# Patient Record
Sex: Female | Born: 1965 | Race: White | Hispanic: No | Marital: Married | Smoking: Former smoker
Health system: Southern US, Community
[De-identification: ages and names within clinical notes are randomized; demographics above are authoritative.]

## PROBLEM LIST (undated history)

## (undated) DIAGNOSIS — L03319 Cellulitis of trunk, unspecified: Secondary | ICD-10-CM

## (undated) DIAGNOSIS — J449 Chronic obstructive pulmonary disease, unspecified: Secondary | ICD-10-CM

## (undated) DIAGNOSIS — I509 Heart failure, unspecified: Secondary | ICD-10-CM

## (undated) DIAGNOSIS — R600 Localized edema: Secondary | ICD-10-CM

## (undated) DIAGNOSIS — I10 Essential (primary) hypertension: Secondary | ICD-10-CM

## (undated) HISTORY — DX: Essential (primary) hypertension: I10

## (undated) HISTORY — DX: Cellulitis of trunk, unspecified: L03.319

## (undated) HISTORY — PX: MASS EXCISION: SHX2000

## (undated) HISTORY — DX: Heart failure, unspecified: I50.9

## (undated) HISTORY — DX: Localized edema: R60.0

---

## 1999-09-05 ENCOUNTER — Emergency Department (HOSPITAL_COMMUNITY): Admission: EM | Admit: 1999-09-05 | Discharge: 1999-09-05 | Payer: Self-pay | Admitting: Emergency Medicine

## 1999-09-22 ENCOUNTER — Emergency Department (HOSPITAL_COMMUNITY): Admission: EM | Admit: 1999-09-22 | Discharge: 1999-09-23 | Payer: Self-pay

## 1999-10-15 ENCOUNTER — Other Ambulatory Visit: Admission: RE | Admit: 1999-10-15 | Discharge: 1999-10-15 | Payer: Self-pay | Admitting: Obstetrics and Gynecology

## 2000-05-28 ENCOUNTER — Emergency Department (HOSPITAL_COMMUNITY): Admission: EM | Admit: 2000-05-28 | Discharge: 2000-05-28 | Payer: Self-pay | Admitting: Emergency Medicine

## 2010-09-07 ENCOUNTER — Emergency Department: Payer: Self-pay | Admitting: Emergency Medicine

## 2011-08-24 ENCOUNTER — Emergency Department (HOSPITAL_COMMUNITY)
Admission: EM | Admit: 2011-08-24 | Discharge: 2011-08-24 | Disposition: A | Discharge status: Home / Self Care | Payer: Self-pay | Attending: Emergency Medicine | Admitting: Emergency Medicine

## 2011-08-24 ENCOUNTER — Encounter (HOSPITAL_COMMUNITY): Payer: Self-pay | Admitting: Emergency Medicine

## 2011-08-24 DIAGNOSIS — E119 Type 2 diabetes mellitus without complications: Secondary | ICD-10-CM | POA: Insufficient documentation

## 2011-08-24 DIAGNOSIS — M545 Low back pain, unspecified: Secondary | ICD-10-CM | POA: Insufficient documentation

## 2011-08-24 HISTORY — DX: Chronic obstructive pulmonary disease, unspecified: J44.9

## 2011-08-24 MED ORDER — CYCLOBENZAPRINE HCL 10 MG PO TABS
10.0000 mg | ORAL_TABLET | Freq: Once | ORAL | Status: AC
  Administered 2011-08-24: 10 mg via ORAL
  Filled 2011-08-24: qty 1

## 2011-08-24 MED ORDER — TRAMADOL HCL 50 MG PO TABS: 50.0000 mg | ORAL_TABLET | Freq: Four times a day (QID) | ORAL | Status: AC | PRN

## 2011-08-24 MED ORDER — CYCLOBENZAPRINE HCL 10 MG PO TABS: 10.0000 mg | ORAL_TABLET | Freq: Three times a day (TID) | ORAL | Status: AC | PRN

## 2011-08-24 MED ORDER — TRAMADOL HCL 50 MG PO TABS
50.0000 mg | ORAL_TABLET | Freq: Once | ORAL | Status: AC
  Administered 2011-08-24: 50 mg via ORAL
  Filled 2011-08-24: qty 1

## 2011-08-24 NOTE — ED Provider Notes (Signed)
History     CSN: [id number]  Arrival date & time 08/24/11  1030   First MD Initiated Contact with Patient 08/24/11 1127      Chief Complaint  Patient presents with  . Back Pain    (Consider location/radiation/quality/duration/timing/severity/associated sxs/prior treatment) Patient is a 46 y.o. female presenting with back pain. The history is provided by the patient.  Back Pain  This is a new problem. The current episode started more than 1 week ago. The problem occurs constantly. The problem has been gradually worsening. The pain is associated with no known injury. The pain radiates to the right thigh and right foot. The symptoms are aggravated by certain positions. Pertinent negatives include no fever, no numbness and no weakness. Associated symptoms comments: Lower right back pain for 2 months, initially intermittent, now constant. No numbness, tingling or weakness. No urinary or fecal incontinence. Worse with movement, still painful with rest at this point. Alleve without relief..    Past Medical History  Diagnosis Date  . Diabetes mellitus   . COPD (chronic obstructive pulmonary disease)     History reviewed. No pertinent past surgical history.  History reviewed. No pertinent family history.  History  Substance Use Topics  . Smoking status: Current Everyday Smoker  . Smokeless tobacco: Not on file  . Alcohol Use: No    OB History    Grav Para Term Preterm Abortions TAB SAB Ect Mult Living                  Review of Systems  Constitutional: Negative for fever.  Gastrointestinal: Negative.  Negative for nausea.  Genitourinary: Negative.   Musculoskeletal: Positive for back pain.  Skin: Negative.   Neurological: Negative.  Negative for weakness and numbness.    Allergies  Review of patient's allergies indicates no known allergies.  Home Medications   Current Outpatient Rx  Name Route Sig Dispense Refill  . ALBUTEROL SULFATE HFA 108 (90 BASE) MCG/ACT IN AERS  Inhalation Inhale 2 puffs into the lungs every 6 (six) hours as needed. For SOB    . ALBUTEROL SULFATE (2.5 MG/3ML) 0.083% IN NEBU Nebulization Take 2.5 mg by nebulization every 6 (six) hours as needed. For SOB    . BECLOMETHASONE DIPROPIONATE 80 MCG/ACT IN AERS Inhalation Inhale 1 puff into the lungs 2 (two) times daily.    . IBUPROFEN 200 MG PO TABS Oral Take 400 mg by mouth every 6 (six) hours as needed. For pain    . METFORMIN HCL 500 MG PO TABS Oral Take 500 mg by mouth 2 (two) times daily with a meal.    . NAPROXEN SODIUM 220 MG PO TABS Oral Take 220 mg by mouth as needed. For pain    . QUINAPRIL HCL 10 MG PO TABS Oral Take 10 mg by mouth at bedtime.      BP 134/78  Pulse 70  Temp(Src) 98.4 F (36.9 C) (Oral)  Resp 18  SpO2 94%  Physical Exam  Constitutional: She is oriented to person, place, and time. She appears well-developed and well-nourished.  HENT:  Head: Normocephalic.  Neck: Normal range of motion. Neck supple.  Cardiovascular: Normal rate and regular rhythm.   Pulmonary/Chest: Effort normal and breath sounds normal.  Abdominal: Soft. Bowel sounds are normal. There is no tenderness. There is no rebound and no guarding.  Musculoskeletal: Normal range of motion.       Mild lumbar and paralumbar tenderness without swelling or discoloration.  Neurological: She is alert and  oriented to person, place, and time. She has normal reflexes. Coordination normal.       Ambulatory with steady gait. Straight leg raise negative bilaterally. No deficits on neurovascular or lower extremity sensory exam.  Skin: Skin is warm and dry. No rash noted.  Psychiatric: She has a normal mood and affect.    ED Course  Procedures (including critical care time)  Labs Reviewed - No data to display No results found.   No diagnosis found. 1. Low back pain   MDM  Pain x 2 months without focal neurologic deficit. Will treat symptomatically and refer to ortho for persistent  symptoms        Rodena Medin, PA-C 08/24/11 1141

## 2011-08-24 NOTE — Discharge Instructions (Signed)
TAKE MEDICATIONS AS PRESCRIBED. CONTINUE TO ALTERNATE WARM AND COOL COMPRESSES AND REST THE BACK. IF NO BETTER IN 3-4 ADDITIONAL DAYS, FOLLOW UP WITH DR. CHANDLER FOR FURTHER OUTPATIENT EVALUATION.  Back Pain, Adult Low back pain is very common. About 1 in 5 people have back pain.The cause of low back pain is rarely dangerous. The pain often gets better over time.About half of people with a sudden onset of back pain feel better in just 2 weeks. About 8 in 10 people feel better by 6 weeks.  CAUSES Some common causes of back pain include:  Strain of the muscles or ligaments supporting the spine.   Wear and tear (degeneration) of the spinal discs.   Arthritis.   Direct injury to the back.  DIAGNOSIS Most of the time, the direct cause of low back pain is not known.However, back pain can be treated effectively even when the exact cause of the pain is unknown.Answering your caregiver's questions about your overall health and symptoms is one of the most accurate ways to make sure the cause of your pain is not dangerous. If your caregiver needs more information, he or she may order lab work or imaging tests (X-rays or MRIs).However, even if imaging tests show changes in your back, this usually does not require surgery. HOME CARE INSTRUCTIONS For many people, back pain returns.Since low back pain is rarely dangerous, it is often a condition that people can learn to [facility] their own.   Remain active. It is stressful on the back to sit or stand in one place. Do not sit, drive, or stand in one place for more than 30 minutes at a time. Take short walks on level surfaces as soon as pain allows.Try to increase the length of time you walk each day.   Do not stay in bed.Resting more than 1 or 2 days can delay your recovery.   Do not avoid exercise or work.Your body is made to move.It is not dangerous to be active, even though your back may hurt.Your back will likely heal faster if you return to  being active before your pain is gone.   Pay attention to your body when you bend and lift. Many people have less discomfortwhen lifting if they bend their knees, keep the load close to their bodies,and avoid twisting. Often, the most comfortable positions are those that put less stress on your recovering back.   Find a comfortable position to sleep. Use a firm mattress and lie on your side with your knees slightly bent. If you lie on your back, put a pillow under your knees.   Only take over-the-counter or prescription medicines as directed by your caregiver. Over-the-counter medicines to reduce pain and inflammation are often the most helpful.Your caregiver may prescribe muscle relaxant drugs.These medicines help dull your pain so you can more quickly return to your normal activities and healthy exercise.   Put ice on the injured area.   Put ice in a plastic bag.   Place a towel between your skin and the bag.   Leave the ice on for 15 to 20 minutes, 3 to 4 times a day for the first 2 to 3 days. After that, ice and heat may be alternated to reduce pain and spasms.   Ask your caregiver about trying back exercises and gentle massage. This may be of some benefit.   Avoid feeling anxious or stressed.Stress increases muscle tension and can worsen back pain.It is important to recognize when you are anxious or  stressed and learn ways to manage it.Exercise is a great option.  SEEK MEDICAL CARE IF:  You have pain that is not relieved with rest or medicine.   You have pain that does not improve in 1 week.   You have new symptoms.   You are generally not feeling well.  SEEK IMMEDIATE MEDICAL CARE IF:   You have pain that radiates from your back into your legs.   You develop new bowel or bladder control problems.   You have unusual weakness or numbness in your arms or legs.   You develop nausea or vomiting.   You develop abdominal pain.   You feel faint.  Document Released:  03/21/2005 Document Revised: 03/10/2011 Document Reviewed: 08/09/2010 [facility] Patient Information 2012 [place], [place].

## 2011-08-24 NOTE — ED Provider Notes (Signed)
Medical screening examination/treatment/procedure(s) were performed by non-physician practitioner and as supervising physician I was immediately available for consultation/collaboration.   Gavin Pound. Tonika Eden, MD 08/24/11 1143

## 2011-08-24 NOTE — ED Notes (Signed)
Pt c/o lower back pain with radiation down right leg x 2 months that is not getting better; pt denies obvious injury

## 2014-05-13 ENCOUNTER — Emergency Department: Payer: Self-pay | Admitting: Emergency Medicine

## 2014-11-27 ENCOUNTER — Emergency Department: Payer: Self-pay

## 2014-11-27 ENCOUNTER — Emergency Department
Admission: EM | Admit: 2014-11-27 | Discharge: 2014-11-27 | Disposition: A | Discharge status: Other Acute Inpatient Hospital | Payer: Self-pay | Attending: Emergency Medicine | Admitting: Emergency Medicine

## 2014-11-27 ENCOUNTER — Encounter: Payer: Self-pay | Admitting: Emergency Medicine

## 2014-11-27 DIAGNOSIS — L03311 Cellulitis of abdominal wall: Secondary | ICD-10-CM | POA: Insufficient documentation

## 2014-11-27 DIAGNOSIS — R000 Tachycardia, unspecified: Secondary | ICD-10-CM | POA: Insufficient documentation

## 2014-11-27 DIAGNOSIS — E119 Type 2 diabetes mellitus without complications: Secondary | ICD-10-CM | POA: Insufficient documentation

## 2014-11-27 DIAGNOSIS — L0390 Cellulitis, unspecified: Secondary | ICD-10-CM

## 2014-11-27 DIAGNOSIS — A419 Sepsis, unspecified organism: Secondary | ICD-10-CM

## 2014-11-27 DIAGNOSIS — Z7951 Long term (current) use of inhaled steroids: Secondary | ICD-10-CM | POA: Insufficient documentation

## 2014-11-27 DIAGNOSIS — Z720 Tobacco use: Secondary | ICD-10-CM | POA: Insufficient documentation

## 2014-11-27 DIAGNOSIS — Z79899 Other long term (current) drug therapy: Secondary | ICD-10-CM | POA: Insufficient documentation

## 2014-11-27 DIAGNOSIS — M726 Necrotizing fasciitis: Secondary | ICD-10-CM | POA: Insufficient documentation

## 2014-11-27 LAB — URINALYSIS COMPLETE WITH MICROSCOPIC (ARMC ONLY)
BILIRUBIN URINE: NEGATIVE
Bacteria, UA: NONE SEEN
LEUKOCYTES UA: NEGATIVE
NITRITE: NEGATIVE
PH: 5 (ref 5.0–8.0)
Protein, ur: 30 mg/dL — AB
Specific Gravity, Urine: 1.029 (ref 1.005–1.030)

## 2014-11-27 LAB — CBC WITH DIFFERENTIAL/PLATELET
BASOS PCT: 0 %
Basophils Absolute: 0 10*3/uL (ref 0–0.1)
EOS ABS: 0 10*3/uL (ref 0–0.7)
Eosinophils Relative: 0 %
HCT: 41.9 % (ref 35.0–47.0)
HEMOGLOBIN: 14.1 g/dL (ref 12.0–16.0)
Lymphocytes Relative: 4 %
Lymphs Abs: 1 10*3/uL (ref 1.0–3.6)
MCH: 29.3 pg (ref 26.0–34.0)
MCHC: 33.7 g/dL (ref 32.0–36.0)
MCV: 87.1 fL (ref 80.0–100.0)
Monocytes Absolute: 1.3 10*3/uL — ABNORMAL HIGH (ref 0.2–0.9)
Monocytes Relative: 5 %
NEUTROS PCT: 91 %
Neutro Abs: 24.3 10*3/uL — ABNORMAL HIGH (ref 1.4–6.5)
Platelets: 267 10*3/uL (ref 150–440)
RBC: 4.81 MIL/uL (ref 3.80–5.20)
RDW: 13.3 % (ref 11.5–14.5)
WBC: 26.6 10*3/uL — AB (ref 3.6–11.0)

## 2014-11-27 LAB — COMPREHENSIVE METABOLIC PANEL
ALK PHOS: 82 U/L (ref 38–126)
ALT: 13 U/L — AB (ref 14–54)
AST: 16 U/L (ref 15–41)
Albumin: 3.3 g/dL — ABNORMAL LOW (ref 3.5–5.0)
Anion gap: 15 (ref 5–15)
BUN: 12 mg/dL (ref 6–20)
CALCIUM: 9 mg/dL (ref 8.9–10.3)
CO2: 26 mmol/L (ref 22–32)
CREATININE: 0.97 mg/dL (ref 0.44–1.00)
Chloride: 88 mmol/L — ABNORMAL LOW (ref 101–111)
Glucose, Bld: 409 mg/dL — ABNORMAL HIGH (ref 65–99)
Potassium: 3.8 mmol/L (ref 3.5–5.1)
Sodium: 129 mmol/L — ABNORMAL LOW (ref 135–145)
Total Bilirubin: 1.4 mg/dL — ABNORMAL HIGH (ref 0.3–1.2)
Total Protein: 7.2 g/dL (ref 6.5–8.1)

## 2014-11-27 LAB — LACTIC ACID, PLASMA
LACTIC ACID, VENOUS: 1.5 mmol/L (ref 0.5–2.0)
LACTIC ACID, VENOUS: 1.1 mmol/L (ref 0.5–2.0)

## 2014-11-27 MED ORDER — IOHEXOL 300 MG/ML  SOLN
100.0000 mL | Freq: Once | INTRAMUSCULAR | Status: AC | PRN
Start: 2014-11-27 — End: 2014-11-27
  Administered 2014-11-27: 100 mL via INTRAVENOUS

## 2014-11-27 MED ORDER — HYDROMORPHONE HCL 1 MG/ML IJ SOLN
1.0000 mg | Freq: Once | INTRAMUSCULAR | Status: AC
  Administered 2014-11-27: 1 mg via INTRAVENOUS
  Filled 2014-11-27: qty 1

## 2014-11-27 MED ORDER — HYDROMORPHONE HCL 1 MG/ML IJ SOLN
INTRAMUSCULAR | Status: AC
  Filled 2014-11-27: qty 1

## 2014-11-27 MED ORDER — HYDROMORPHONE HCL 1 MG/ML IJ SOLN: 1.0000 mg | Freq: Once | INTRAMUSCULAR | Status: AC

## 2014-11-27 MED ORDER — SODIUM CHLORIDE 0.9 % IV BOLUS (SEPSIS)
30.0000 mL/kg | Freq: Once | INTRAVENOUS | Status: AC
  Administered 2014-11-27: 1000 mL via INTRAVENOUS

## 2014-11-27 MED ORDER — IPRATROPIUM-ALBUTEROL 0.5-2.5 (3) MG/3ML IN SOLN
3.0000 mL | Freq: Once | RESPIRATORY_TRACT | Status: AC
  Administered 2014-11-27: 3 mL via RESPIRATORY_TRACT
  Filled 2014-11-27: qty 3

## 2014-11-27 MED ORDER — IOHEXOL 240 MG/ML SOLN
25.0000 mL | INTRAMUSCULAR | Status: AC
  Administered 2014-11-27 (×2): 25 mL via ORAL

## 2014-11-27 MED ORDER — PIPERACILLIN-TAZOBACTAM 3.375 G IVPB 30 MIN
3.3750 g | Freq: Once | INTRAVENOUS | Status: AC
  Administered 2014-11-27: 3.375 g via INTRAVENOUS
  Filled 2014-11-27: qty 50

## 2014-11-27 MED ORDER — ONDANSETRON HCL 4 MG/2ML IJ SOLN
4.0000 mg | Freq: Once | INTRAMUSCULAR | Status: AC
  Administered 2014-11-27: 4 mg via INTRAVENOUS
  Filled 2014-11-27: qty 2

## 2014-11-27 MED ORDER — VANCOMYCIN HCL IN DEXTROSE 1-5 GM/200ML-% IV SOLN
1000.0000 mg | Freq: Once | INTRAVENOUS | Status: AC
  Administered 2014-11-27: 1000 mg via INTRAVENOUS
  Filled 2014-11-27: qty 200

## 2014-11-27 MED ORDER — SODIUM CHLORIDE 0.9 % IV BOLUS (SEPSIS): 1000.0000 mL | Freq: Once | INTRAVENOUS | Status: DC

## 2014-11-27 NOTE — ED Provider Notes (Signed)
[facility] Emergency Department Provider Note   ____________________________________________  Time seen: 8 AM I have reviewed the triage vital signs and the triage nursing note.  HISTORY  Chief Complaint Abdominal Pain and Rash   Historian Patient  HPI Belinda Young Belinda Young is a 49 y.o. female who noticed a bite or bump on the mons pubis area last Friday, and she had been soaking in Epsom salts, and it just got bigger over the past 6 days. For the past 3 days she hasn't been able to eat due to nausea and vomiting. She's had chills without a known fever. Symptoms are moderate to severe. No coughing, no trouble breathing. Redness and swelling have extended from the mons pubis up to the pannus of the abdomen    Past Medical History  Diagnosis Date  . Diabetes mellitus   . COPD (chronic obstructive pulmonary disease)     Patient Active Problem List   Diagnosis Date Noted  . Sepsis due to cellulitis     No past surgical history on file.  Current Outpatient Rx  Name  Route  Sig  Dispense  Refill  . glipiZIDE (GLUCOTROL) 5 MG tablet   Oral   Take 5 mg by mouth daily before breakfast.         . metFORMIN (GLUCOPHAGE) 500 MG tablet   Oral   Take 500 mg by mouth 2 (two) times daily with a meal.         . quinapril (ACCUPRIL) 10 MG tablet   Oral   Take 10 mg by mouth at bedtime.         Marland Kitchen albuterol (PROVENTIL HFA;VENTOLIN HFA) 108 (90 BASE) MCG/ACT inhaler   Inhalation   Inhale 2 puffs into the lungs every 6 (six) hours as needed. For SOB         . albuterol (PROVENTIL) (2.5 MG/3ML) 0.083% nebulizer solution   Nebulization   Take 2.5 mg by nebulization every 6 (six) hours as needed. For SOB         . beclomethasone (QVAR) 80 MCG/ACT inhaler   Inhalation   Inhale 1 puff into the lungs 2 (two) times daily.         Marland Kitchen ibuprofen (ADVIL,MOTRIN) 200 MG tablet   Oral   Take 400 mg by mouth every 6 (six) hours as needed. For pain         .  naproxen sodium (ANAPROX) 220 MG tablet   Oral   Take 220 mg by mouth as needed. For pain           Allergies Review of patient's allergies indicates no known allergies.  No family history on file.  Social History Social History  Substance Use Topics  . Smoking status: Current Every Day Smoker  . Smokeless tobacco: None  . Alcohol Use: Yes    Review of Systems  Constitutional: Chills Eyes: Negative for visual changes. ENT: Negative for sore throat. Cardiovascular: Negative for chest pain. Respiratory: Negative for shortness of breath. Gastrointestinal: Negative for diarrhea Genitourinary: Negative for dysuria. Musculoskeletal: Negative for back pain. Skin: Positive for redness and swelling as per history of present illness Neurological: Negative for headache. 10 point Review of Systems otherwise negative ____________________________________________   PHYSICAL EXAM:  VITAL SIGNS: ED Triage Vitals  Enc Vitals Group     BP 11/27/14 0800 139/67 mmHg     Pulse Rate 11/27/14 0800 131     Resp 11/27/14 0800 24     Temp 11/27/14 0800  98.1 F (36.7 C)     Temp Source 11/27/14 0800 Oral     SpO2 11/27/14 0800 92 %     Weight --      Height --      Head Cir --      Peak Flow --      Pain Score 11/27/14 0801 10     Pain Loc --      Pain Edu? --      Excl. in GC? --      Constitutional: Alert and oriented. Well appearing overall, but tearful and in pain. Eyes: Conjunctivae are normal. PERRL. Normal extraocular movements. ENT   Head: Normocephalic and atraumatic.   Nose: No congestion/rhinnorhea.   Mouth/Throat: Mucous membranes are moist.   Neck: No stridor. Cardiovascular/Chest: Tachycardic, but regular rhythm.  No murmurs, rubs, or gallops. Respiratory: Normal respiratory effort without tachypnea nor retractions. Breath sounds are clear and equal bilaterally. No wheezes/rales/rhonchi. Gastrointestinal: Soft. No distention, no guarding, no rebound.  Tender on the underside of the pannus where it is erythematous and streaking. No crepitus.  Genitourinary/rectal: No focal area of fluctuance, however the mons pubis is extremely swollen and red without any drainage. There is no crepitus. Musculoskeletal: Nontender with normal range of motion in all extremities. No joint effusions.  No lower extremity tenderness.  No edema. Neurologic:  Normal speech and language. No gross or focal neurologic deficits are appreciated. Skin:  Skin is warm, dry and intact. No rash noted. Psychiatric: Mood and affect are normal. Speech and behavior are normal. Patient exhibits appropriate insight and judgment.  ____________________________________________   EKG I, Governor Rooks, MD, the attending physician have personally viewed and interpreted all ECGs.  120 bpm. Sinus tachycardia. Narrow QRS. Normal axis. Normal ST and T-wave. ____________________________________________  LABS (pertinent positives/negatives)  Lactate 1.1 Complete metabolic panel significant for sodium 129, glucose 49 and otherwise no significant abnormalities White blood, 26.6 with a left shift. Hemoglobin 14.1 and the count is 267 Lactate is 1.5 Urinalysis is negative for signs of infection. There are 2+ ketones and greater than 500 glucose  ____________________________________________  RADIOLOGY All Xrays were viewed by me. Imaging interpreted by Radiologist.  Chest x-ray portable: Negative  CT pelvis:  IMPRESSION: Extensive subcutaneous fat stranding and emphysema within the left groin extending to the lower abdominal wall. Findings are concerning for infectious process with gas-forming organisms, necrotizing fasciitis is not excluded.  These results were called by telephone at the time of interpretation on 11/27/2014 at 1:41 pm to Dr. Governor Rooks , who verbally acknowledged these results.  __________________________________________  PROCEDURES  Procedure(s) performed:  None  Critical Care performed: CRITICAL CARE Performed by: Governor Rooks   Total critical care time: 45 minutes Critical care time was exclusive of separately billable procedures and treating other patients.  Critical care was necessary to treat or prevent imminent or life-threatening deterioration.  Critical care was time spent personally by me on the following activities: development of treatment plan with patient and/or surrogate as well as nursing, discussions with consultants, evaluation of patient's response to treatment, examination of patient, obtaining history from patient or surrogate, ordering and performing treatments and interventions, ordering and review of laboratory studies, ordering and review of radiographic studies, pulse oximetry and re-evaluation of patient's condition.   ____________________________________________   ED COURSE / ASSESSMENT AND PLAN  CONSULTATIONS: Patient is consultation with general surgeon, Dr. Excell Seltzer. He is clinically concerned about excising fasciitis with extension into the left thigh as well as the mons  pubis, and suspects the patient may need a larger surgery that requires orthopedic, general surgery, and possibly plastic surgery reconstruction, and is recommending transfer to tertiary care center.  Pertinent labs & imaging results that were available during my care of the patient were reviewed by me and considered in my medical decision making (see chart for details).  I'm concerned about possible sepsis in this patient who is weighted approximately one week since onset, with generalized symptoms over the past 3 days, and obvious large cellulitis of the pannus in abdomen, and tachycardia. She was started on sepsis protocol, started with IV fluid bolus, antibiotics to cover for sepsis due to cellulitis. I chose Zosyn and vancomycin. Clinically on exam I don't find an area of focal fluctuance or pointing to blindly open. I plan to obtain a CT of the  pelvis if the kidneys are amenable to this to assess whether or not there might be an underlying abscess for which surgical labs would be helpful. Patient will need hospital admission due to the severity of her condition.   ----------------------------------------- 2:23 PM on 11/27/2014 -----------------------------------------  I discussed the results of the CT scan with the radiologist and reviewed myself the images which are showing gas in the affected area and tracking down into the left groin as well. I discussed the case with general surgeon who recommended tertiary care due to the likely large debridement that will be needed. Patient care was accepted in transfer by Dr. Myrtis Ser to [facility] emergency Department directly. Patient will take [facility] helicopter as this is the fastest transport at this point time.  No hypotension. Patient does have return of pain and she's been treated with Dilaudid for symptom control. Patient and family member were updated to the severity of the condition.  Patient / Family / Caregiver informed of clinical course, medical decision-making process, and agree with plan.     ___________________________________________   FINAL CLINICAL IMPRESSION(S) / ED DIAGNOSES   Final diagnoses:  Sepsis due to cellulitis  Necrotizing fasciitis       Governor Rooks, MD 11/27/14 1426

## 2014-11-27 NOTE — ED Notes (Signed)
Patient transported to CT 

## 2014-11-27 NOTE — ED Notes (Signed)
Report given to [facility] with [facility] flight and Missy with [facility].

## 2014-11-27 NOTE — ED Notes (Signed)
Says started with itching left leg and thoughrt it was abscess.  Now it has spread into abd and she has fever and vomiting

## 2014-11-27 NOTE — Consult Note (Signed)
Surgical Consultation  11/27/2014  Belinda Young is an 49 y.o. female.   CC: Left groin pain  HPI: I was called to the emergency room to see a patient with likely necrotizing fasciitis.  Patient describes having a small pimple or boil that she picked at 5 or 6 days ago and it has gradually worsened since then she states that this is the worst pain she has ever had. She's had fevers chills nausea vomiting and is shaking and a chill right now. She's also had diarrhea. She is diabetic. He also smokes tobacco products. She states the pain started on her inner thigh on the left side and extended up onto her fold that her pubis and into her abdomen. She also has pain in her medial thigh. He has had no drainage.  Past Medical History  Diagnosis Date  . Diabetes mellitus   . COPD (chronic obstructive pulmonary disease)     No past surgical history on file.  No family history on file.  Social History:  reports that she has been smoking.  She does not have any smokeless tobacco history on file. She reports that she drinks alcohol. She reports that she does not use illicit drugs.  Allergies: No Known Allergies  Medications reviewed.   Review of Systems:   Review of Systems  Constitutional: Positive for fever, chills, malaise/fatigue and diaphoresis.  HENT: Negative for sore throat.   Eyes: Negative.   Respiratory: Negative for cough, hemoptysis, shortness of breath and wheezing.   Cardiovascular: Negative for chest pain, palpitations and leg swelling.  Gastrointestinal: Positive for nausea, vomiting, abdominal pain and diarrhea. Negative for heartburn, constipation, blood in stool and melena.  Genitourinary: Positive for flank pain. Negative for dysuria, urgency, frequency and hematuria.  Musculoskeletal: Negative.   Skin: Negative.        Boil  Neurological: Positive for weakness and headaches.  Endo/Heme/Allergies: Negative.   Psychiatric/Behavioral: Negative.      Physical  Exam:  BP 108/67 mmHg  Pulse 114  Temp(Src) 98.1 F (36.7 C) (Oral)  Resp 20  Wt 232 lb (105.235 kg)  SpO2 97%  LMP 11/02/2014  Physical Exam  Constitutional: She is oriented to person, place, and time and well-developed, well-nourished, and in no distress.  Obese He is in the present time experiencing shaking chill  HENT:  Head: Normocephalic and atraumatic.  Eyes: Pupils are equal, round, and reactive to light.  Neck: Normal range of motion. Neck supple.  Cardiovascular: Normal rate and regular rhythm.   Pulmonary/Chest: Effort normal. No respiratory distress. She has no wheezes.  Abdominal: Soft. She exhibits no distension. There is no tenderness. There is no rebound and no guarding.  Musculoskeletal: She exhibits edema and tenderness.  Inspection of the vulva left groin left thigh and perianal area demonstrates massive edema especially of the suprapubic area and vulva she is markedly tender throughout from her posterior buttock area onto her medial thigh in fact she is extremely or exquisitely tender in the medial thigh and extends up into the pubis and towards the left groin and abdomen there is edema and peau d'orange especially of the mons pubis. Areas no drainage and no obvious crepitus palpable..  Lymphadenopathy:    She has no cervical adenopathy.  Neurological: She is alert and oriented to person, place, and time.  Skin: There is erythema.  Psychiatric: Affect normal.      Results for orders placed or performed during the hospital encounter of 11/27/14 (from the past 48 hour(s))  Comprehensive metabolic panel     Status: Abnormal   Collection Time: 11/27/14  8:31 AM  Result Value Ref Range   Sodium 129 (L) 135 - 145 mmol/L   Potassium 3.8 3.5 - 5.1 mmol/L   Chloride 88 (L) 101 - 111 mmol/L   CO2 26 22 - 32 mmol/L   Glucose, Bld 409 (H) 65 - 99 mg/dL   BUN 12 6 - 20 mg/dL   Creatinine, Ser 0.97 0.44 - 1.00 mg/dL   Calcium 9.0 8.9 - 10.3 mg/dL   Total Protein  7.2 6.5 - 8.1 g/dL   Albumin 3.3 (L) 3.5 - 5.0 g/dL   AST 16 15 - 41 U/L   ALT 13 (L) 14 - 54 U/L   Alkaline Phosphatase 82 38 - 126 U/L   Total Bilirubin 1.4 (H) 0.3 - 1.2 mg/dL   GFR calc non Af Amer >60 >60 mL/min   GFR calc Af Amer >60 >60 mL/min    Comment: (NOTE) The eGFR has been calculated using the CKD EPI equation. This calculation has not been validated in all clinical situations. eGFR's persistently <60 mL/min signify possible Chronic Kidney Disease.    Anion gap 15 5 - 15  CBC with Differential     Status: Abnormal   Collection Time: 11/27/14  8:31 AM  Result Value Ref Range   WBC 26.6 (H) 3.6 - 11.0 K/uL   RBC 4.81 3.80 - 5.20 MIL/uL   Hemoglobin 14.1 12.0 - 16.0 g/dL   HCT 41.9 35.0 - 47.0 %   MCV 87.1 80.0 - 100.0 fL   MCH 29.3 26.0 - 34.0 pg   MCHC 33.7 32.0 - 36.0 g/dL   RDW 13.3 11.5 - 14.5 %   Platelets 267 150 - 440 K/uL   Neutrophils Relative % 91 %   Neutro Abs 24.3 (H) 1.4 - 6.5 K/uL   Lymphocytes Relative 4 %   Lymphs Abs 1.0 1.0 - 3.6 K/uL   Monocytes Relative 5 %   Monocytes Absolute 1.3 (H) 0.2 - 0.9 K/uL   Eosinophils Relative 0 %   Eosinophils Absolute 0.0 0 - 0.7 K/uL   Basophils Relative 0 %   Basophils Absolute 0.0 0 - 0.1 K/uL  Lactic acid, plasma     Status: None   Collection Time: 11/27/14  8:31 AM  Result Value Ref Range   Lactic Acid, Venous 1.5 0.5 - 2.0 mmol/L  Urinalysis complete, with microscopic     Status: Abnormal   Collection Time: 11/27/14  8:31 AM  Result Value Ref Range   Color, Urine YELLOW (A) YELLOW   APPearance CLEAR (A) CLEAR   Glucose, UA >500 (A) NEGATIVE mg/dL   Bilirubin Urine NEGATIVE NEGATIVE   Ketones, ur 2+ (A) NEGATIVE mg/dL   Specific Gravity, Urine 1.029 1.005 - 1.030   Hgb urine dipstick 2+ (A) NEGATIVE   pH 5.0 5.0 - 8.0   Protein, ur 30 (A) NEGATIVE mg/dL   Nitrite NEGATIVE NEGATIVE   Leukocytes, UA NEGATIVE NEGATIVE   RBC / HPF 0-5 0 - 5 RBC/hpf   WBC, UA 0-5 0 - 5 WBC/hpf   Bacteria, UA NONE  SEEN NONE SEEN   Squamous Epithelial / LPF 0-5 (A) NONE SEEN  Lactic acid, plasma     Status: None   Collection Time: 11/27/14 12:25 PM  Result Value Ref Range   Lactic Acid, Venous 1.1 0.5 - 2.0 mmol/L   Ct Pelvis W Contrast  11/27/2014   CLINICAL DATA:  Fever and vomiting.  Possible leg abscess.  EXAM: CT PELVIS WITH CONTRAST  TECHNIQUE: Multidetector CT imaging of the pelvis was performed using the standard protocol following the bolus administration of intravenous contrast.  CONTRAST:  118m OMNIPAQUE IOHEXOL 300 MG/ML  SOLN  COMPARISON:  None.  FINDINGS: There is extensive subcutaneous soft tissues emphysema and stranding within the left groin extending into the left and central lower abdominal wall. Several probably reactive left inguinal lymph nodes are seen. There is no evidence of intramuscular or intra-abdominal drainable fluid collection.  There is a wide neck fat containing infraumbilical anterior abdominal wall hernia.  No evidence of fluid within the pelvis or abdomen. Visualized large and small bowel loops are normal in caliber and distribution. Scattered sigmoid diverticulosis is seen.  Normal female genitalia without evidence of suspicious masses.  Visualized portions of the liver, bilateral kidneys have normal appearance.  IMPRESSION: Extensive subcutaneous fat stranding and emphysema within the left groin extending to the lower abdominal wall. Findings are concerning for infectious process with gas-forming organisms, necrotizing fasciitis is not excluded.  These results were called by telephone at the time of interpretation on 11/27/2014 at 1:41 pm to Dr. RLisa Roca, who verbally acknowledged these results.   Electronically Signed   By: DFidela SalisburyM.D.   On: 11/27/2014 13:43   Dg Chest Port 1 View  11/27/2014   CLINICAL DATA:  Fever today.  Smoker.  EXAM: PORTABLE CHEST - 1 VIEW  COMPARISON:  None.  FINDINGS: The lungs are clear. Heart size is normal. No pneumothorax or  pleural effusion. No focal bony abnormality.  IMPRESSION: No acute disease.   Electronically Signed   By: TInge RiseM.D.   On: 11/27/2014 11:48    Assessment/Plan:  This is a critically ill patient she is floridly septic in a shaking chill at this point and this is likely representative of SIRS related to a necrotizing deep soft tissue infection involving the medial thigh towards the femur all the bone and extending up into the mons pubis and up onto the left groin. CT scan is personally reviewed as are her labs showing elevated white blood cell count etc. This patient is in critical condition and requires urgent if not emergent operation to save her life I discussed with Dr. LReita Clichethis patient's emergent condition and the fact that without a full complement of surgical subspecialties including plastic surgery for coverage this patient could quite possibly lose her life. [facility] to care for this patient at this time especially since we have no plastic surgery team to cover such a likely large resulting wound. But more importantly this gas forming organism is pervasive through the soft tissues involving multiple anatomic areas and is appearing extremely aggressive. I spoke with Dr. LReita [facility]concerning [facility].  RFlorene Glen MD, FACS

## 2014-11-29 LAB — URINE CULTURE

## 2014-12-02 LAB — CULTURE, BLOOD (ROUTINE X 2)
CULTURE: NO GROWTH
Culture: NO GROWTH

## 2019-05-30 ENCOUNTER — Encounter: Payer: Self-pay | Admitting: *Deleted

## 2019-06-03 ENCOUNTER — Other Ambulatory Visit: Payer: Self-pay

## 2019-06-03 ENCOUNTER — Encounter: Payer: Self-pay | Admitting: Cardiology

## 2019-06-03 ENCOUNTER — Ambulatory Visit (INDEPENDENT_AMBULATORY_CARE_PROVIDER_SITE_OTHER): Payer: Medicaid Other | Admitting: Cardiology

## 2019-06-03 VITALS — BP 126/70 | HR 95 | Ht 67.0 in | Wt 280.5 lb

## 2019-06-03 DIAGNOSIS — R600 Localized edema: Secondary | ICD-10-CM

## 2019-06-03 DIAGNOSIS — F17200 Nicotine dependence, unspecified, uncomplicated: Secondary | ICD-10-CM | POA: Diagnosis not present

## 2019-06-03 NOTE — Progress Notes (Signed)
Cardiology Office Note:    Date:  06/03/2019   ID:  BERT GIVANS, DOB 1965-11-03, MRN [id number]  PCP:  Sandrea Hughs, NP  Cardiologist:  Debbe Odea, MD  Electrophysiologist:  None   Referring MD: Marya Fossa, PA-C   Chief Complaint  Patient presents with  . New Patient (Initial Visit)    RLE edema; Meds verbally reviewed with patient.    History of Present Illness:    SHEYLI HORWITZ is a 54 y.o. female with a hx of COPD, diabetes, current smoker x30+ years who presents due to lower extremity edema.  Patient noticed bilateral lower extremity edema 10 days ago.  She saw her primary care provider who put her on diuretics and antibiotics.  The left lower extremity improved but the right seem to stay the same if not get slightly worse.  The right lower extremity edema also appeared reddish.  She denies any history of heart disease.  She is a current smoker x30+ years.  She is working on quitting, now down to 3 cigarettes a day.  She denies any history of blood clots.  Past Medical History:  Diagnosis Date  . Cellulitis of trunk   . COPD (chronic obstructive pulmonary disease) (HCC)   . Diabetes mellitus   . Pedal edema     Past Surgical History:  Procedure Laterality Date  . CESAREAN SECTION    . MASS EXCISION     spider bite in pubic area    Current Medications: Current Meds  Medication Sig  . albuterol (PROVENTIL HFA;VENTOLIN HFA) 108 (90 BASE) MCG/ACT inhaler Inhale 2 puffs into the lungs every 6 (six) hours as needed. For SOB  . albuterol (PROVENTIL) (2.5 MG/3ML) 0.083% nebulizer solution Take 2.5 mg by nebulization every 6 (six) hours as needed. For SOB  . Fluticasone-Salmeterol (ADVAIR) 250-50 MCG/DOSE AEPB Inhale 1 puff into the lungs 2 (two) times daily.  Marland Kitchen gabapentin (NEURONTIN) 300 MG capsule Take 300 mg by mouth at bedtime.  . Insulin Aspart (NOVOLOG FLEXPEN [place]) Inject 15 Units into the skin with breakfast, with lunch, and with evening meal.  . insulin NPH  Human (NOVOLIN N) 100 UNIT/ML injection Inject 15 Units into the skin in the morning and at bedtime.  . quinapril (ACCUPRIL) 10 MG tablet Take 10 mg by mouth at bedtime.  Marland Kitchen tiotropium (SPIRIVA) 18 MCG inhalation capsule Place 18 mcg into inhaler and inhale daily.     Allergies:   Patient has no known allergies.   Social History   Socioeconomic History  . Marital status: Married    Spouse name: Not on file  . Number of children: Not on file  . Years of education: Not on file  . Highest education level: Not on file  Occupational History  . Not on file  Tobacco Use  . Smoking status: Current Every Day Smoker    Packs/day: 0.25    Types: Cigarettes  . Smokeless tobacco: Never Used  Substance and Sexual Activity  . Alcohol use: Yes  . Drug use: No  . Sexual activity: Not on file  Other Topics Concern  . Not on file  Social History Narrative  . Not on file   Social Determinants of Health   Financial Resource Strain:   . Difficulty of Paying Living Expenses: Not on file  Food Insecurity:   . Worried About Programme researcher, broadcasting/film/video in the Last Year: Not on file  . Ran Out of Food in the Last Year: Not on file  Transportation Needs:   . Freight forwarder (Medical): Not on file  . Lack of Transportation (Non-Medical): Not on file  Physical Activity:   . Days of Exercise per Week: Not on file  . Minutes of Exercise per Session: Not on file  Stress:   . Feeling of Stress : Not on file  Social Connections:   . Frequency of Communication with Friends and Family: Not on file  . Frequency of Social Gatherings with Friends and Family: Not on file  . Attends Religious Services: Not on file  . Active Member of Clubs or Organizations: Not on file  . Attends Banker Meetings: Not on file  . Marital Status: Not on file     Family History: The patient's family history includes Aneurysm in her father; COPD in her mother; Diabetes in her mother; Heart failure in her  mother.  ROS:   Please see the history of present illness.     All other systems reviewed and are negative.  EKGs/Labs/Other Studies Reviewed:    The following studies were reviewed today:   EKG:  EKG is  ordered today.  The ekg ordered today demonstrates normal sinus rhythm.  Recent Labs: No results found for requested labs within last 8760 hours.  Recent Lipid Panel No results found for: CHOL, TRIG, HDL, CHOLHDL, VLDL, LDLCALC, LDLDIRECT  Physical Exam:    VS:  BP 126/70 (BP Location: Right Arm, Patient Position: Sitting, Cuff Size: Large)   Pulse 95   Ht 5\' 7"  (1.702 m)   Wt 280 lb 8 oz (127.2 kg)   SpO2 91%   BMI 43.93 kg/m     Wt Readings from Last 3 Encounters:  06/03/19 280 lb 8 oz (127.2 kg)  11/27/14 232 lb (105.2 kg)     GEN:  Well nourished, well developed in no acute distress HEENT: Normal NECK: No JVD; No carotid bruits  LYMPHATICS: No lymphadenopathy CARDIAC: RRR, no murmurs, rubs, gallops RESPIRATORY: Crackles at left lung base noted ABDOMEN: Soft, non-tender, non-distended MUSCULOSKELETAL: 1+ edema on the right lower extremity, RLE appears erythematous.  Trace edema in the left lower extremity. SKIN: Warm and dry NEUROLOGIC:  Alert and oriented x 3 PSYCHIATRIC:  Normal affect   ASSESSMENT:    1. Edema leg   2. Smoking    PLAN:    In order of problems listed above:  1. Patient with bilateral lower extremity edema worse on the right.  Right lower extremity appears erythematous.  Will get echocardiogram to rule out any structural heart disease.  We will also get a venous lower extremity ultrasound to evaluate for DVT. 2. Patient with long history of smoking.  Smoking cessation advised, over 5 minutes spent advising patient.  Follow-up after echocardiogram and lower extremity ultrasound.  This note was generated in part or whole with voice recognition software. Voice recognition is usually quite accurate but there are transcription errors that  can and very often do occur. I apologize for any typographical errors that were not detected and corrected.  Medication Adjustments/Labs and Tests Ordered: Current medicines are reviewed at length with the patient today.  Concerns regarding medicines are outlined above.  Orders Placed This Encounter  Procedures  . EKG 12-Lead  . ECHOCARDIOGRAM COMPLETE  . VAS 11/29/14 LOWER EXTREMITY VENOUS (DVT)   No orders of the defined types were placed in this encounter.   Patient Instructions  Medication Instructions:  - Your physician recommends that you continue on your current medications as directed.  Please refer to the Current Medication list given to you today.  *If you need a refill on your cardiac medications before your next appointment, please call your pharmacy*   Lab Work: - none ordered  If you have labs (blood work) drawn today and your tests are completely normal, you will receive your results only by: Marland Kitchen MyChart Message (if you have MyChart) OR . A paper copy in the mail If you have any lab test that is abnormal or we need to change your treatment, we will call you to review the results.   Testing/Procedures: - Your physician has requested that you have a lower extremity venous duplex. This test is an ultrasound of the veins in the legs or arms. It looks at venous blood flow that carries blood from the heart to the legs or arms. Allow one hour for a Lower Venous exam. Allow thirty minutes for an Upper Venous exam. There are no restrictions or special instructions.   - Your physician has requested that you have an echocardiogram. Echocardiography is a painless test that uses sound waves to create images of your heart. It provides your doctor with information about the size and shape of your heart and how well your heart's chambers and valves are working. This procedure takes approximately one hour. There are no restrictions for this procedure.    Follow-Up: At [facility], you  and your health needs are our priority.  As part of our continuing mission to provide you with exceptional heart care, we have created designated Provider Care Teams.  These Care Teams include your primary Cardiologist (physician) and Advanced Practice Providers (APPs -  Physician Assistants and Nurse Practitioners) who all work together to provide you with the care you need, when you need it.  We recommend signing up for the patient portal called "MyChart".  Sign up information is provided on this After Visit Summary.  MyChart is used to connect with patients for Virtual Visits (Telemedicine).  Patients are able to view lab/test results, encounter notes, upcoming appointments, etc.  Non-urgent messages can be sent to your provider as well.   To learn more about what you can do with MyChart, go to [email or web address].    Your next appointment:   After your testing is completed   The format for your next appointment:   In Person  Provider:   Debbe Odea, MD   Other Instructions N/a   Echocardiogram An echocardiogram is a procedure that uses painless sound waves (ultrasound) to produce an image of the heart. Images from an echocardiogram can provide important information about:  Signs of coronary artery disease (CAD).  Aneurysm detection. An aneurysm is a weak or damaged part of an artery wall that bulges out from the normal force of blood pumping through the body.  Heart size and shape. Changes in the size or shape of the heart can be associated with certain conditions, including heart failure, aneurysm, and CAD.  Heart muscle function.  Heart valve function.  Signs of a past heart attack.  Fluid buildup around the heart.  Thickening of the heart muscle.  A tumor or infectious growth around the heart valves. Tell a health care provider about:  Any allergies you have.  All medicines you are taking, including vitamins, herbs, eye drops, creams, and over-the-counter  medicines.  Any blood disorders you have.  Any surgeries you have had.  Any medical conditions you have.  Whether you are pregnant or may be pregnant. What are the risks?  Generally, this is a safe procedure. However, problems may occur, including:  Allergic reaction to dye (contrast) that may be used during the procedure. What happens before the procedure? No specific preparation is needed. You may eat and drink normally. What happens during the procedure?   An IV tube may be inserted into one of your veins.  You may receive contrast through this tube. A contrast is an injection that improves the quality of the pictures from your heart.  A gel will be applied to your chest.  A wand-like tool (transducer) will be moved over your chest. The gel will help to transmit the sound waves from the transducer.  The sound waves will harmlessly bounce off of your heart to allow the heart images to be captured in real-time motion. The images will be recorded on a computer. The procedure may vary among health care providers and hospitals. What happens after the procedure?  You may return to your normal, everyday life, including diet, activities, and medicines, unless your health care provider tells you not to do that. Summary  An echocardiogram is a procedure that uses painless sound waves (ultrasound) to produce an image of the heart.  Images from an echocardiogram can provide important information about the size and shape of your heart, heart muscle function, heart valve function, and fluid buildup around your heart.  You do not need to do anything to prepare before this procedure. You may eat and drink normally.  After the echocardiogram is completed, you may return to your normal, everyday life, unless your health care provider tells you not to do that. This information is not intended to replace advice given to you by your health care provider. Make sure you discuss any questions you  have with your health care provider. Document Revised: 07/12/2018 Document Reviewed: 04/23/2016 Elsevier Patient Education  2020 [place], Kate Sable, MD  06/03/2019 5:26 PM    [employer]

## 2019-06-03 NOTE — Patient Instructions (Signed)
Medication Instructions:  - Your physician recommends that you continue on your current medications as directed. Please refer to the Current Medication list given to you today.  *If you need a refill on your cardiac medications before your next appointment, please call your pharmacy*   Lab Work: - none ordered  If you have labs (blood work) drawn today and your tests are completely normal, you will receive your results only by: Marland Kitchen MyChart Message (if you have MyChart) OR . A paper copy in the mail If you have any lab test that is abnormal or we need to change your treatment, we will call you to review the results.   Testing/Procedures: - Your physician has requested that you have a lower extremity venous duplex. This test is an ultrasound of the veins in the legs or arms. It looks at venous blood flow that carries blood from the heart to the legs or arms. Allow one hour for a Lower Venous exam. Allow thirty minutes for an Upper Venous exam. There are no restrictions or special instructions.   - Your physician has requested that you have an echocardiogram. Echocardiography is a painless test that uses sound waves to create images of your heart. It provides your doctor with information about the size and shape of your heart and how well your heart's chambers and valves are working. This procedure takes approximately one hour. There are no restrictions for this procedure.    Follow-Up: At [facility], you and your health needs are our priority.  As part of our continuing mission to provide you with exceptional heart care, we have created designated Provider Care Teams.  These Care Teams include your primary Cardiologist (physician) and Advanced Practice Providers (APPs -  Physician Assistants and Nurse Practitioners) who all work together to provide you with the care you need, when you need it.  We recommend signing up for the patient portal called "MyChart".  Sign up information is provided  on this After Visit Summary.  MyChart is used to connect with patients for Virtual Visits (Telemedicine).  Patients are able to view lab/test results, encounter notes, upcoming appointments, etc.  Non-urgent messages can be sent to your provider as well.   To learn more about what you can do with MyChart, go to [email or web address].    Your next appointment:   After your testing is completed   The format for your next appointment:   In Person  Provider:   Debbe Odea, MD   Other Instructions N/a   Echocardiogram An echocardiogram is a procedure that uses painless sound waves (ultrasound) to produce an image of the heart. Images from an echocardiogram can provide important information about:  Signs of coronary artery disease (CAD).  Aneurysm detection. An aneurysm is a weak or damaged part of an artery wall that bulges out from the normal force of blood pumping through the body.  Heart size and shape. Changes in the size or shape of the heart can be associated with certain conditions, including heart failure, aneurysm, and CAD.  Heart muscle function.  Heart valve function.  Signs of a past heart attack.  Fluid buildup around the heart.  Thickening of the heart muscle.  A tumor or infectious growth around the heart valves. Tell a health care provider about:  Any allergies you have.  All medicines you are taking, including vitamins, herbs, eye drops, creams, and over-the-counter medicines.  Any blood disorders you have.  Any surgeries you have had.  Any medical conditions  you have.  Whether you are pregnant or may be pregnant. What are the risks? Generally, this is a safe procedure. However, problems may occur, including:  Allergic reaction to dye (contrast) that may be used during the procedure. What happens before the procedure? No specific preparation is needed. You may eat and drink normally. What happens during the procedure?   An IV tube may be  inserted into one of your veins.  You may receive contrast through this tube. A contrast is an injection that improves the quality of the pictures from your heart.  A gel will be applied to your chest.  A wand-like tool (transducer) will be moved over your chest. The gel will help to transmit the sound waves from the transducer.  The sound waves will harmlessly bounce off of your heart to allow the heart images to be captured in real-time motion. The images will be recorded on a computer. The procedure may vary among health care providers and hospitals. What happens after the procedure?  You may return to your normal, everyday life, including diet, activities, and medicines, unless your health care provider tells you not to do that. Summary  An echocardiogram is a procedure that uses painless sound waves (ultrasound) to produce an image of the heart.  Images from an echocardiogram can provide important information about the size and shape of your heart, heart muscle function, heart valve function, and fluid buildup around your heart.  You do not need to do anything to prepare before this procedure. You may eat and drink normally.  After the echocardiogram is completed, you may return to your normal, everyday life, unless your health care provider tells you not to do that. This information is not intended to replace advice given to you by your health care provider. Make sure you discuss any questions you have with your health care provider. Document Revised: 07/12/2018 Document Reviewed: 04/23/2016 Elsevier Patient Education  [place].

## 2019-06-05 ENCOUNTER — Other Ambulatory Visit: Payer: Self-pay | Admitting: Primary Care

## 2019-06-05 DIAGNOSIS — R600 Localized edema: Secondary | ICD-10-CM

## 2019-06-06 ENCOUNTER — Other Ambulatory Visit: Payer: Self-pay

## 2019-06-06 ENCOUNTER — Ambulatory Visit
Admission: RE | Admit: 2019-06-06 | Discharge: 2019-06-06 | Disposition: A | Discharge status: Home / Self Care | Payer: Medicaid Other | Source: Ambulatory Visit | Attending: Primary Care | Admitting: Primary Care

## 2019-06-06 DIAGNOSIS — R600 Localized edema: Secondary | ICD-10-CM | POA: Diagnosis present

## 2019-06-14 ENCOUNTER — Emergency Department: Payer: Medicaid Other

## 2019-06-14 ENCOUNTER — Inpatient Hospital Stay
Admission: EM | Admit: 2019-06-14 | Discharge: 2019-06-17 | DRG: 292 | Disposition: A | Discharge status: Home / Self Care | Payer: Medicaid Other | Attending: Family Medicine | Admitting: Family Medicine

## 2019-06-14 ENCOUNTER — Other Ambulatory Visit: Payer: Self-pay

## 2019-06-14 DIAGNOSIS — Z791 Long term (current) use of non-steroidal anti-inflammatories (NSAID): Secondary | ICD-10-CM

## 2019-06-14 DIAGNOSIS — L03115 Cellulitis of right lower limb: Secondary | ICD-10-CM | POA: Diagnosis present

## 2019-06-14 DIAGNOSIS — J441 Chronic obstructive pulmonary disease with (acute) exacerbation: Secondary | ICD-10-CM | POA: Diagnosis present

## 2019-06-14 DIAGNOSIS — I5031 Acute diastolic (congestive) heart failure: Secondary | ICD-10-CM | POA: Diagnosis not present

## 2019-06-14 DIAGNOSIS — Z7951 Long term (current) use of inhaled steroids: Secondary | ICD-10-CM | POA: Diagnosis not present

## 2019-06-14 DIAGNOSIS — I5021 Acute systolic (congestive) heart failure: Secondary | ICD-10-CM | POA: Diagnosis not present

## 2019-06-14 DIAGNOSIS — L299 Pruritus, unspecified: Secondary | ICD-10-CM | POA: Diagnosis not present

## 2019-06-14 DIAGNOSIS — Z825 Family history of asthma and other chronic lower respiratory diseases: Secondary | ICD-10-CM | POA: Diagnosis not present

## 2019-06-14 DIAGNOSIS — Z20822 Contact with and (suspected) exposure to covid-19: Secondary | ICD-10-CM | POA: Diagnosis present

## 2019-06-14 DIAGNOSIS — Z833 Family history of diabetes mellitus: Secondary | ICD-10-CM | POA: Diagnosis not present

## 2019-06-14 DIAGNOSIS — Z8249 Family history of ischemic heart disease and other diseases of the circulatory system: Secondary | ICD-10-CM

## 2019-06-14 DIAGNOSIS — Z6841 Body Mass Index (BMI) 40.0 and over, adult: Secondary | ICD-10-CM | POA: Diagnosis not present

## 2019-06-14 DIAGNOSIS — I248 Other forms of acute ischemic heart disease: Secondary | ICD-10-CM | POA: Diagnosis present

## 2019-06-14 DIAGNOSIS — Z87891 Personal history of nicotine dependence: Secondary | ICD-10-CM

## 2019-06-14 DIAGNOSIS — R0600 Dyspnea, unspecified: Secondary | ICD-10-CM | POA: Diagnosis not present

## 2019-06-14 DIAGNOSIS — I110 Hypertensive heart disease with heart failure: Secondary | ICD-10-CM | POA: Diagnosis not present

## 2019-06-14 DIAGNOSIS — Z794 Long term (current) use of insulin: Secondary | ICD-10-CM

## 2019-06-14 DIAGNOSIS — I5033 Acute on chronic diastolic (congestive) heart failure: Secondary | ICD-10-CM | POA: Diagnosis not present

## 2019-06-14 DIAGNOSIS — I509 Heart failure, unspecified: Secondary | ICD-10-CM

## 2019-06-14 DIAGNOSIS — Z79899 Other long term (current) drug therapy: Secondary | ICD-10-CM | POA: Diagnosis not present

## 2019-06-14 DIAGNOSIS — E119 Type 2 diabetes mellitus without complications: Secondary | ICD-10-CM | POA: Diagnosis present

## 2019-06-14 DIAGNOSIS — I5030 Unspecified diastolic (congestive) heart failure: Secondary | ICD-10-CM | POA: Diagnosis not present

## 2019-06-14 DIAGNOSIS — Z720 Tobacco use: Secondary | ICD-10-CM | POA: Diagnosis not present

## 2019-06-14 LAB — CBC WITH DIFFERENTIAL/PLATELET
Abs Immature Granulocytes: 0.04 10*3/uL (ref 0.00–0.07)
Basophils Absolute: 0.1 10*3/uL (ref 0.0–0.1)
Basophils Relative: 1 %
Eosinophils Absolute: 0.4 10*3/uL (ref 0.0–0.5)
Eosinophils Relative: 4 %
HCT: 47 % — ABNORMAL HIGH (ref 36.0–46.0)
Hemoglobin: 13.7 g/dL (ref 12.0–15.0)
Immature Granulocytes: 0 %
Lymphocytes Relative: 17 %
Lymphs Abs: 1.9 10*3/uL (ref 0.7–4.0)
MCH: 26.1 pg (ref 26.0–34.0)
MCHC: 29.1 g/dL — ABNORMAL LOW (ref 30.0–36.0)
MCV: 89.5 fL (ref 80.0–100.0)
Monocytes Absolute: 0.6 10*3/uL (ref 0.1–1.0)
Monocytes Relative: 5 %
Neutro Abs: 8.1 10*3/uL — ABNORMAL HIGH (ref 1.7–7.7)
Neutrophils Relative %: 73 %
Platelets: 300 10*3/uL (ref 150–400)
RBC: 5.25 MIL/uL — ABNORMAL HIGH (ref 3.87–5.11)
RDW: 16.5 % — ABNORMAL HIGH (ref 11.5–15.5)
WBC: 11.1 10*3/uL — ABNORMAL HIGH (ref 4.0–10.5)
nRBC: 0 % (ref 0.0–0.2)

## 2019-06-14 LAB — BASIC METABOLIC PANEL
Anion gap: 10 (ref 5–15)
BUN: 14 mg/dL (ref 6–20)
CO2: 33 mmol/L — ABNORMAL HIGH (ref 22–32)
Calcium: 9.3 mg/dL (ref 8.9–10.3)
Chloride: 92 mmol/L — ABNORMAL LOW (ref 98–111)
Creatinine, Ser: 0.95 mg/dL (ref 0.44–1.00)
GFR calc Af Amer: >60 mL/min (ref >60)
GFR calc non Af Amer: >60 mL/min (ref >60)
Glucose, Bld: 135 mg/dL — ABNORMAL HIGH (ref 70–99)
Potassium: 4.5 mmol/L (ref 3.5–5.1)
Sodium: 135 mmol/L (ref 135–145)

## 2019-06-14 LAB — BRAIN NATRIURETIC PEPTIDE: B Natriuretic Peptide: 94 pg/mL (ref 0.0–100.0)

## 2019-06-14 LAB — TROPONIN I (HIGH SENSITIVITY): Troponin I (High Sensitivity): 27 ng/L — ABNORMAL HIGH (ref <18)

## 2019-06-14 MED ORDER — SODIUM CHLORIDE 0.9% FLUSH
3.0000 mL | Freq: Two times a day (BID) | INTRAVENOUS | Status: DC
  Administered 2019-06-15 – 2019-06-17 (×5): 3 mL via INTRAVENOUS

## 2019-06-14 MED ORDER — BUDESONIDE 0.25 MG/2ML IN SUSP
2.0000 mL | Freq: Two times a day (BID) | RESPIRATORY_TRACT | Status: DC
  Administered 2019-06-15 – 2019-06-17 (×6): 0.25 mg via RESPIRATORY_TRACT
  Filled 2019-06-14 (×6): qty 2

## 2019-06-14 MED ORDER — GLIPIZIDE 5 MG PO TABS
5.0000 mg | ORAL_TABLET | Freq: Every day | ORAL | Status: DC
  Filled 2019-06-14: qty 1

## 2019-06-14 MED ORDER — ONDANSETRON HCL 4 MG/2ML IJ SOLN: 4.0000 mg | Freq: Four times a day (QID) | INTRAMUSCULAR | Status: DC | PRN

## 2019-06-14 MED ORDER — ZOLPIDEM TARTRATE 5 MG PO TABS: 5.0000 mg | ORAL_TABLET | Freq: Every evening | ORAL | Status: DC | PRN

## 2019-06-14 MED ORDER — INSULIN NPH (HUMAN) (ISOPHANE) 100 UNIT/ML ~~LOC~~ SUSP: 15.0000 [IU] | Freq: Two times a day (BID) | SUBCUTANEOUS | Status: DC

## 2019-06-14 MED ORDER — SODIUM CHLORIDE 0.9% FLUSH: 3.0000 mL | INTRAVENOUS | Status: DC | PRN

## 2019-06-14 MED ORDER — SODIUM CHLORIDE 0.9 % IV SOLN: 250.0000 mL | INTRAVENOUS | Status: DC | PRN

## 2019-06-14 MED ORDER — FUROSEMIDE 40 MG PO TABS: 20.0000 mg | ORAL_TABLET | Freq: Every day | ORAL | Status: DC

## 2019-06-14 MED ORDER — FUROSEMIDE 10 MG/ML IJ SOLN
40.0000 mg | Freq: Two times a day (BID) | INTRAMUSCULAR | Status: DC
  Administered 2019-06-15: 40 mg via INTRAVENOUS
  Filled 2019-06-14: qty 4

## 2019-06-14 MED ORDER — ACETAMINOPHEN 325 MG PO TABS: 650.0000 mg | ORAL_TABLET | ORAL | Status: DC | PRN

## 2019-06-14 MED ORDER — ENOXAPARIN SODIUM 40 MG/0.4ML ~~LOC~~ SOLN
40.0000 mg | SUBCUTANEOUS | Status: DC
  Administered 2019-06-15 – 2019-06-16 (×3): 40 mg via SUBCUTANEOUS
  Filled 2019-06-14 (×3): qty 0.4

## 2019-06-14 MED ORDER — ALPRAZOLAM 0.25 MG PO TABS: 0.2500 mg | ORAL_TABLET | Freq: Two times a day (BID) | ORAL | Status: DC | PRN

## 2019-06-14 MED ORDER — LISINOPRIL 10 MG PO TABS
10.0000 mg | ORAL_TABLET | Freq: Every day | ORAL | Status: DC
  Administered 2019-06-15: 10 mg via ORAL
  Filled 2019-06-14: qty 1

## 2019-06-14 MED ORDER — ALBUTEROL SULFATE (2.5 MG/3ML) 0.083% IN NEBU
2.5000 mg | INHALATION_SOLUTION | Freq: Once | RESPIRATORY_TRACT | Status: AC
  Administered 2019-06-14: 2.5 mg via RESPIRATORY_TRACT
  Filled 2019-06-14: qty 3

## 2019-06-14 MED ORDER — GABAPENTIN 300 MG PO CAPS
300.0000 mg | ORAL_CAPSULE | Freq: Every day | ORAL | Status: DC
  Administered 2019-06-15 – 2019-06-16 (×2): 300 mg via ORAL
  Filled 2019-06-14 (×2): qty 1

## 2019-06-14 MED ORDER — IPRATROPIUM-ALBUTEROL 0.5-2.5 (3) MG/3ML IN SOLN
3.0000 mL | Freq: Four times a day (QID) | RESPIRATORY_TRACT | Status: DC
  Administered 2019-06-15 – 2019-06-17 (×9): 3 mL via RESPIRATORY_TRACT
  Filled 2019-06-14 (×9): qty 3

## 2019-06-14 MED ORDER — FUROSEMIDE 10 MG/ML IJ SOLN
40.0000 mg | Freq: Once | INTRAMUSCULAR | Status: AC
  Administered 2019-06-14: 40 mg via INTRAVENOUS
  Filled 2019-06-14: qty 4

## 2019-06-14 NOTE — ED Triage Notes (Signed)
Patient reports feeling increased short of breath (hx of COPD) for past 2 weeks.  Patient also report being concerned because it seems like she is retaining fluid.

## 2019-06-14 NOTE — ED Notes (Signed)
Patient placed on O2 at 2 liters via nasal cannula. °

## 2019-06-14 NOTE — ED Provider Notes (Signed)
[facility] Emergency Department Provider Note ____________________________________________   First MD Initiated Contact with Patient 06/14/19 2203     (approximate)  I have reviewed the triage vital signs and the nursing notes.   HISTORY  Chief Complaint Shortness of Breath and Leg Pain    HPI Belinda Young is a 54 y.o. female with PMH as noted below including a history of COPD but no known cardiac history who presents with shortness of breath, gradual onset over the last 2 weeks, worse with exertion and with lying flat.  She denies any paroxysmal nocturnal dyspnea.  She also reports bilateral lower extremity swelling although this is improved over the last few days.  The patient also reports that she recently was treated for an infection in the right leg and lower abdomen with doxycycline.  She was also evaluated for a blood clot in the leg, but this was negative.  Past Medical History:  Diagnosis Date  . Cellulitis of trunk   . COPD (chronic obstructive pulmonary disease) ([place])   . Diabetes mellitus   . Pedal edema     Patient Active Problem List   Diagnosis Date Noted  . Sepsis due to cellulitis [facility])     Past Surgical History:  Procedure Laterality Date  . CESAREAN SECTION    . MASS EXCISION     spider bite in pubic area    Prior to Admission medications   Medication Sig Start Date End Date Taking? Authorizing Provider  albuterol (PROVENTIL HFA;VENTOLIN HFA) 108 (90 BASE) MCG/ACT inhaler Inhale 2 puffs into the lungs every 6 (six) hours as needed. For SOB   Yes [provider]  albuterol (PROVENTIL) (2.5 MG/3ML) 0.083% nebulizer solution Take 2.5 mg by nebulization every 6 (six) hours as needed. For SOB   Yes [provider]  beclomethasone (QVAR) 80 MCG/ACT inhaler Inhale 1 puff into the lungs 2 (two) times daily.   Yes [provider]  cephALEXin (KEFLEX) 500 MG capsule Take 500 mg by mouth 4 (four) times daily.    Yes [provider]  Fluticasone-Salmeterol (ADVAIR) 250-50 MCG/DOSE AEPB Inhale 1 puff into the lungs 2 (two) times daily.   Yes [provider]  furosemide (LASIX) 20 MG tablet Take 20 mg by mouth daily.   Yes [provider]  gabapentin (NEURONTIN) 300 MG capsule Take 300 mg by mouth at bedtime.   Yes [provider]  glipiZIDE (GLUCOTROL) 5 MG tablet Take 5 mg by mouth daily before breakfast.   Yes [provider]  Insulin Aspart (NOVOLOG FLEXPEN [place]) Inject 20 Units into the skin with breakfast, with lunch, and with evening meal.    Yes [provider]  insulin NPH Human (NOVOLIN N) 100 UNIT/ML injection Inject 15 Units into the skin in the morning and at bedtime.   Yes [provider]  quinapril (ACCUPRIL) 10 MG tablet Take 20 mg by mouth at bedtime.    Yes [provider]  sulfamethoxazole-trimethoprim (BACTRIM DS) 800-160 MG tablet Take 1 tablet by mouth 2 (two) times daily.   Yes [provider]  ibuprofen (ADVIL,MOTRIN) 200 MG tablet Take 400 mg by mouth every 6 (six) hours as needed. For pain    [provider]    Allergies Patient has no known allergies.  Family History  Problem Relation Age of Onset  . COPD Mother   . Heart failure Mother   . Diabetes Mother   . Aneurysm Father  Social History Social History   Tobacco Use  . Smoking status: Current Every Day Smoker    Packs/day: 0.25    Types: Cigarettes  . Smokeless tobacco: Never Used  Substance Use Topics  . Alcohol use: Yes  . Drug use: No    Review of Systems  Constitutional: No fever/chills. Eyes: No visual changes. ENT: No sore throat. Cardiovascular: Denies chest pain. Respiratory: Positive for shortness of breath. Gastrointestinal: No vomiting or diarrhea.  Genitourinary: Negative for dysuria.  Musculoskeletal: Negative for back pain. Skin: Negative for rash. Neurological: Negative for  headache.   ____________________________________________   PHYSICAL EXAM:  VITAL SIGNS: ED Triage Vitals  Enc Vitals Group     BP 06/14/19 1917 (!) 144/57     Pulse Rate 06/14/19 1917 87     Resp 06/14/19 1917 (!) 22     Temp 06/14/19 1917 98.6 F (37 C)     Temp Source 06/14/19 1917 Oral     SpO2 06/14/19 1917 (!) 87 %     Weight 06/14/19 1913 280 lb (127 kg)     Height 06/14/19 1913 5\' 7"  (1.702 m)     Head Circumference --      Peak Flow --      Pain Score 06/14/19 1913 0     Pain Loc --      Pain Edu? --      Excl. in GC? --     Constitutional: Alert and oriented.  Somewhat uncomfortable appearing but in no acute distress. Eyes: Conjunctivae are normal.  Head: Atraumatic. Nose: No congestion/rhinnorhea. Mouth/Throat: Mucous membranes are moist.   Neck: Normal range of motion.  Cardiovascular: Normal rate, regular rhythm. Grossly normal heart sounds.  Good peripheral circulation. Respiratory: Increased respiratory effort.  Decreased breath sounds and wheezing bilaterally.. Gastrointestinal: Soft and nontender. No distention.  Genitourinary: No flank tenderness. Musculoskeletal: 1+ bilateral lower extremity edema, slightly worse on the right.  Extremities warm and well perfused.  Neurologic:  Normal speech and language. No gross focal neurologic deficits are appreciated.  Skin:  Skin is warm and dry. No rash noted. Psychiatric: Mood and affect are normal. Speech and behavior are normal.  ____________________________________________   LABS (all labs ordered are listed, but only abnormal results are displayed)  Labs Reviewed  CBC WITH DIFFERENTIAL/PLATELET - Abnormal; Notable for the following components:      Result Value   WBC 11.1 (*)    RBC 5.25 (*)    HCT 47.0 (*)    MCHC 29.1 (*)    RDW 16.5 (*)    Neutro Abs 8.1 (*)    All other components within normal limits  BASIC METABOLIC PANEL - Abnormal; Notable for the following components:   Chloride 92 (*)     CO2 33 (*)    Glucose, Bld 135 (*)    All other components within normal limits  SARS CORONAVIRUS 2 (TAT 6-24 HRS)  BRAIN NATRIURETIC PEPTIDE  TROPONIN I (HIGH SENSITIVITY)   ____________________________________________  EKG  ED ECG REPORT I, 08/14/19, the attending physician, personally viewed and interpreted this ECG.  Date: 06/14/2019 EKG Time: 1918 Rate: 86 Rhythm: normal sinus rhythm QRS Axis: normal Intervals: normal ST/T Wave abnormalities: normal Narrative Interpretation: no evidence of acute ischemia  ____________________________________________  RADIOLOGY  CXR: Vascular congestion and mild edema consistent with CHF  ____________________________________________   PROCEDURES  Procedure(s) performed: No  Procedures  Critical Care performed: No ____________________________________________   INITIAL IMPRESSION / ASSESSMENT AND PLAN / ED COURSE  Pertinent labs & imaging results that were available during my care of the patient were reviewed by me and considered in my medical decision making (see chart for details).  54 year old female with PMH as noted above including COPD (not on home O2) and no prior cardiac history presents with gradual onset of shortness of breath over the last 2 weeks associated with some lower extremity edema.  The patient reports a recent history of cellulitis and a negative right lower extremity ultrasound.  I reviewed the past medical records in epic.  I confirmed that the patient had an ultrasound of the RLE on 3/4 which was negative for acute DVT.  She was also ordered for an echocardiogram, but has not had this yet.    On exam, the patient has increased work of breathing but no acute distress.  O2 saturation is in the mid 90s on 2 L by nasal cannula, but drops around 86 to 87% on room air.  She has diffuse wheezing bilaterally.  She has mild bilateral lower extremity edema somewhat worse on the right.  Initial lab  work-up is unremarkable.  The chest x-ray shows findings concerning for CHF.  Overall I suspect most likely new onset CHF.  Differential includes COPD exacerbation, acute bronchitis, pneumonia, COVID-19.  I have ordered bronchodilators, Lasix, and added on additional labs to help differentiate between CHF and COPD.  Given the hypoxia, I anticipate admission.  ----------------------------------------- 11:20 PM on 06/14/2019 -----------------------------------------  Troponin and BNP are pending.  I discussed the case with Dr. Arville Care from the hospitalist service for admission.  ___________________________  [name] was evaluated in Emergency Department on 06/14/2019 for the symptoms described in the history of present illness. She was evaluated in the context of the global COVID-19 pandemic, which necessitated consideration that the patient might be at risk for infection with the SARS-CoV-2 virus that causes COVID-19. Institutional protocols and algorithms that pertain to the evaluation of patients at risk for COVID-19 are in a state of rapid change based on information released by regulatory bodies including the CDC and federal and state organizations. These policies and algorithms were followed during the patient's care in the ED.  ____________________________________________   FINAL CLINICAL IMPRESSION(S) / ED DIAGNOSES  Final diagnoses:  Acute congestive heart failure, unspecified heart failure type (HCC)      NEW MEDICATIONS STARTED DURING THIS VISIT:  New Prescriptions   No medications on file     Note:  This document was prepared using Dragon voice recognition software and may include unintentional dictation errors.    Dionne Bucy, MD 06/14/19 2320

## 2019-06-15 ENCOUNTER — Encounter: Payer: Self-pay | Admitting: Family Medicine

## 2019-06-15 DIAGNOSIS — E119 Type 2 diabetes mellitus without complications: Secondary | ICD-10-CM

## 2019-06-15 DIAGNOSIS — J441 Chronic obstructive pulmonary disease with (acute) exacerbation: Secondary | ICD-10-CM

## 2019-06-15 DIAGNOSIS — I509 Heart failure, unspecified: Secondary | ICD-10-CM

## 2019-06-15 DIAGNOSIS — R0600 Dyspnea, unspecified: Secondary | ICD-10-CM

## 2019-06-15 DIAGNOSIS — I5021 Acute systolic (congestive) heart failure: Secondary | ICD-10-CM

## 2019-06-15 DIAGNOSIS — Z720 Tobacco use: Secondary | ICD-10-CM

## 2019-06-15 DIAGNOSIS — R0609 Other forms of dyspnea: Secondary | ICD-10-CM | POA: Insufficient documentation

## 2019-06-15 LAB — BASIC METABOLIC PANEL
Anion gap: 11 (ref 5–15)
BUN: 14 mg/dL (ref 6–20)
CO2: 35 mmol/L — ABNORMAL HIGH (ref 22–32)
Calcium: 9.1 mg/dL (ref 8.9–10.3)
Chloride: 90 mmol/L — ABNORMAL LOW (ref 98–111)
Creatinine, Ser: 1.02 mg/dL — ABNORMAL HIGH (ref 0.44–1.00)
GFR calc Af Amer: >60 mL/min (ref >60)
GFR calc non Af Amer: >60 mL/min (ref >60)
Glucose, Bld: 192 mg/dL — ABNORMAL HIGH (ref 70–99)
Potassium: 4.2 mmol/L (ref 3.5–5.1)
Sodium: 136 mmol/L (ref 135–145)

## 2019-06-15 LAB — GLUCOSE, CAPILLARY
Glucose-Capillary: 258 mg/dL — ABNORMAL HIGH (ref 70–99)
Glucose-Capillary: 225 mg/dL — ABNORMAL HIGH (ref 70–99)

## 2019-06-15 LAB — HEMOGLOBIN A1C
Hgb A1c MFr Bld: 7.7 % — ABNORMAL HIGH (ref 4.8–5.6)
Mean Plasma Glucose: 174.29 mg/dL

## 2019-06-15 LAB — SARS CORONAVIRUS 2 (TAT 6-24 HRS): SARS Coronavirus 2: NEGATIVE

## 2019-06-15 LAB — TROPONIN I (HIGH SENSITIVITY): Troponin I (High Sensitivity): 25 ng/L — ABNORMAL HIGH (ref <18)

## 2019-06-15 MED ORDER — DIPHENHYDRAMINE HCL 12.5 MG/5ML PO ELIX
12.5000 mg | ORAL_SOLUTION | Freq: Four times a day (QID) | ORAL | Status: DC | PRN
  Administered 2019-06-15: 12.5 mg via ORAL
  Filled 2019-06-15 (×3): qty 5

## 2019-06-15 MED ORDER — PREDNISONE 20 MG PO TABS
40.0000 mg | ORAL_TABLET | Freq: Every day | ORAL | Status: DC
  Administered 2019-06-16 – 2019-06-17 (×2): 40 mg via ORAL
  Filled 2019-06-15 (×2): qty 2

## 2019-06-15 MED ORDER — FUROSEMIDE 10 MG/ML IJ SOLN
40.0000 mg | Freq: Two times a day (BID) | INTRAMUSCULAR | Status: DC
  Administered 2019-06-15 – 2019-06-17 (×4): 40 mg via INTRAVENOUS
  Filled 2019-06-15 (×4): qty 4

## 2019-06-15 MED ORDER — INSULIN ASPART 100 UNIT/ML ~~LOC~~ SOLN
0.0000 [IU] | Freq: Every day | SUBCUTANEOUS | Status: DC
  Filled 2019-06-15 (×2): qty 1

## 2019-06-15 MED ORDER — INSULIN ASPART 100 UNIT/ML ~~LOC~~ SOLN
0.0000 [IU] | Freq: Three times a day (TID) | SUBCUTANEOUS | Status: DC
  Administered 2019-06-15: 8 [IU] via SUBCUTANEOUS
  Administered 2019-06-15: 5 [IU] via SUBCUTANEOUS
  Administered 2019-06-16: 11 [IU] via SUBCUTANEOUS
  Administered 2019-06-16: 5 [IU] via SUBCUTANEOUS
  Filled 2019-06-15 (×2): qty 1

## 2019-06-15 MED ORDER — INSULIN DETEMIR 100 UNIT/ML ~~LOC~~ SOLN
12.0000 [IU] | Freq: Two times a day (BID) | SUBCUTANEOUS | Status: DC
  Administered 2019-06-15 – 2019-06-17 (×5): 12 [IU] via SUBCUTANEOUS
  Filled 2019-06-15 (×7): qty 0.12

## 2019-06-15 NOTE — ED Notes (Signed)
RN in room. Pt asked when she would be going to a room on the floor. Pt states that her family is getting upset that she had not been moved to inpatient room yet and that they are wanting to take her to [facility]. Pt informed that we are waiting to get her a bed and that leaving and going to another hospital would delay her admission process. Pt states that family is just anxious because they are not allowed to be with her. RN explained once she is moved to the floor that she will be allowed one visitor during her stay. Pt verbalized understanding and stated that her phone is cutting in and out. Pt was provided with hospital phone to call and update her family.

## 2019-06-15 NOTE — ED Notes (Signed)
Requested ED tech to transport

## 2019-06-15 NOTE — Progress Notes (Signed)
Cardiology Consultation:   Patient ID: Belinda Young MRN: [id number]; DOB: 07-01-1965  Admit date: 06/14/2019 Date of Consult: 06/15/2019  Primary Care Provider: Sandrea Hughs, NP Primary Cardiologist: Debbe Odea, MD  Primary Electrophysiologist:  None    Patient Profile:   Belinda Young is a 54 y.o. female with a hx of diabetes, COPD who is being seen today for the evaluation of shortness of breath at the request of Dr. Arville Care.  History of Present Illness:   Ms. Belinda Young is a 54 year old female with history of COPD, former smoker x30+ years quit smoking 4 days ago, who presents due to 1 week of worsening shortness of breath with exertion.  Patient states having difficulty with usual activities of daily living such as walking or going upstairs.  She denies chest pain, fevers but endorses occasional dry cough.  In the ED, high-sensitivity troponins were 25, 27, EKG showed normal sinus rhythm with heart rate 86, BNP was normal at 94.  This x-ray showed vascular congestion.  Patient was started on Lasix, and also given a breathing treatment which improved her symptoms.  She has a history of right lower extremity cellulitis.  Which is improved with antibiotics.  Lower extremity ultrasound did not reveal any DVT in the right lower extremity.   Past Medical History:  Diagnosis Date  . Cellulitis of trunk   . COPD (chronic obstructive pulmonary disease) (HCC)   . Diabetes mellitus   . Pedal edema     Past Surgical History:  Procedure Laterality Date  . CESAREAN SECTION    . MASS EXCISION     spider bite in pubic area     Home Medications:  Prior to Admission medications   Medication Sig Start Date End Date Taking? Authorizing Provider  albuterol (PROVENTIL HFA;VENTOLIN HFA) 108 (90 BASE) MCG/ACT inhaler Inhale 2 puffs into the lungs every 6 (six) hours as needed. For SOB   Yes [provider]  albuterol (PROVENTIL) (2.5 MG/3ML) 0.083% nebulizer solution Take 2.5 mg by  nebulization every 6 (six) hours as needed. For SOB   Yes [provider]  beclomethasone (QVAR) 80 MCG/ACT inhaler Inhale 1 puff into the lungs 2 (two) times daily.   Yes [provider]  cephALEXin (KEFLEX) 500 MG capsule Take 500 mg by mouth 4 (four) times daily.   Yes [provider]  Fluticasone-Salmeterol (ADVAIR) 250-50 MCG/DOSE AEPB Inhale 1 puff into the lungs 2 (two) times daily.   Yes [provider]  furosemide (LASIX) 20 MG tablet Take 20 mg by mouth daily.   Yes [provider]  gabapentin (NEURONTIN) 300 MG capsule Take 300 mg by mouth at bedtime.   Yes [provider]  glipiZIDE (GLUCOTROL) 5 MG tablet Take 5 mg by mouth daily before breakfast.   Yes [provider]  Insulin Aspart (NOVOLOG FLEXPEN [place]) Inject 20 Units into the skin with breakfast, with lunch, and with evening meal.    Yes [provider]  insulin NPH Human (NOVOLIN N) 100 UNIT/ML injection Inject 15 Units into the skin in the morning and at bedtime.   Yes [provider]  quinapril (ACCUPRIL) 10 MG tablet Take 20 mg by mouth at bedtime.    Yes [provider]  sulfamethoxazole-trimethoprim (BACTRIM DS) 800-160 MG tablet Take 1 tablet by mouth 2 (two) times daily.   Yes [provider]  ibuprofen (ADVIL,MOTRIN) 200 MG tablet Take 400 mg by mouth every 6 (six) hours as needed. For pain  [provider]    Inpatient Medications: Scheduled Meds: . budesonide  2 mL Inhalation BID  . enoxaparin (LOVENOX) injection  40 mg Subcutaneous Q24H  . furosemide  40 mg Intravenous BID  . gabapentin  300 mg Oral QHS  . insulin aspart  0-15 Units Subcutaneous TID WC  . insulin aspart  0-5 Units Subcutaneous QHS  . insulin detemir  12 Units Subcutaneous BID AC & HS  . ipratropium-albuterol  3 mL Nebulization QID  . lisinopril  10 mg Oral Daily  . [START ON 06/16/2019] predniSONE  40 mg Oral Q breakfast  . sodium  chloride flush  3 mL Intravenous Q12H   Continuous Infusions: . sodium chloride     PRN Meds: sodium chloride, acetaminophen, ALPRAZolam, ondansetron (ZOFRAN) IV, sodium chloride flush  Allergies:   No Known Allergies  Social History:   Social History   Socioeconomic History  . Marital status: Married    Spouse name: Not on file  . Number of children: Not on file  . Years of education: Not on file  . Highest education level: Not on file  Occupational History  . Not on file  Tobacco Use  . Smoking status: Current Every Day Smoker    Packs/day: 0.25    Types: Cigarettes  . Smokeless tobacco: Never Used  Substance and Sexual Activity  . Alcohol use: Yes  . Drug use: No  . Sexual activity: Not on file  Other Topics Concern  . Not on file  Social History Narrative  . Not on file   Social Determinants of Health   Financial Resource Strain:   . Difficulty of Paying Living Expenses:   Food Insecurity:   . Worried About Charity fundraiser in the Last Year:   . Arboriculturist in the Last Year:   Transportation Needs:   . Film/video editor (Medical):   Marland Kitchen Lack of Transportation (Non-Medical):   Physical Activity:   . Days of Exercise per Week:   . Minutes of Exercise per Session:   Stress:   . Feeling of Stress :   Social Connections:   . Frequency of Communication with Friends and Family:   . Frequency of Social Gatherings with Friends and Family:   . Attends Religious Services:   . Active Member of Clubs or Organizations:   . Attends Archivist Meetings:   Marland Kitchen Marital Status:   Intimate Partner Violence:   . Fear of Current or Ex-Partner:   . Emotionally Abused:   Marland Kitchen Physically Abused:   . Sexually Abused:     Family History:    Family History  Problem Relation Age of Onset  . COPD Mother   . Heart failure Mother   . Diabetes Mother   . Aneurysm Father      ROS:  Please see the history of present illness.   All other ROS reviewed and  negative.     Physical Exam/Data:   Vitals:   06/15/19 1353 06/15/19 1430 06/15/19 1515 06/15/19 1600  BP: 127/64     Pulse:  (!) 54 79 78  Resp: 18     Temp: 98.4 F (36.9 C)     TempSrc: Oral     SpO2: 95% 98% 94% 94%  Weight:      Height:        Intake/Output Summary (Last 24 hours) at 06/15/2019 1609 Last data filed at 06/15/2019 1153 Gross per 24 hour  Intake 3 ml  Output  3200 ml  Net -3197 ml   Last 3 Weights 06/14/2019 06/03/2019 11/27/2014  Weight (lbs) 280 lb 280 lb 8 oz 232 lb  Weight (kg) 127.007 kg 127.234 kg 105.235 kg     Body mass index is 43.85 kg/m.  General:  Well nourished, well developed, in no acute distress HEENT: normal Lymph: no adenopathy Neck: no JVD Endocrine:  No thryomegaly Vascular: No carotid bruits; FA pulses 2+ bilaterally without bruits  Cardiac:  normal S1, S2; RRR; no murmur  Lungs: Bilateral wheezing noted Abd: soft, nontender, no hepatomegaly  Ext: Right lower extremity edema noted. Musculoskeletal:  No deformities, BUE and BLE strength normal and equal Skin: warm and dry  Neuro:  CNs 2-12 intact, no focal abnormalities noted Psych:  Normal affect   EKG:  The EKG was personally reviewed and demonstrates: Normal sinus rhythm Telemetry:  Telemetry was personally reviewed and demonstrates: Sinus rhythm  Relevant CV Studies: Echocardiogram pending  Laboratory Data:  High Sensitivity Troponin:   Recent Labs  Lab 06/14/19 1923 06/15/19 0410  TROPONINIHS 27* 25*     Chemistry Recent Labs  Lab 06/14/19 1923 06/15/19 0410  NA 135 136  K 4.5 4.2  CL 92* 90*  CO2 33* 35*  GLUCOSE 135* 192*  BUN 14 14  CREATININE 0.95 1.02*  CALCIUM 9.3 9.1  GFRNONAA >60 >60  GFRAA >60 >60  ANIONGAP 10 11    No results for input(s): PROT, ALBUMIN, AST, ALT, ALKPHOS, BILITOT in the last 168 hours. Hematology Recent Labs  Lab 06/14/19 1923  WBC 11.1*  RBC 5.25*  HGB 13.7  HCT 47.0*  MCV 89.5  MCH 26.1  MCHC 29.1*  RDW 16.5*    PLT 300   BNP Recent Labs  Lab 06/14/19 1923  BNP 94.0    DDimer No results for input(s): DDIMER in the last 168 hours.   Radiology/Studies:  DG Chest 2 View  Result Date: 06/14/2019 CLINICAL DATA:  Shortness of breath for 2 weeks EXAM: CHEST - 2 VIEW COMPARISON:  11/27/2014 FINDINGS: Cardiac shadow is at the upper limits of normal in size. Mild vascular congestion is seen with mild interstitial edema. No sizable effusion is noted. No bony abnormality is noted. IMPRESSION: Early CHF. Electronically Signed   By: Alcide Clever M.D.   On: 06/14/2019 19:51         Assessment and Plan:   1.  Dyspnea on exertion -Symptoms improved with breathing treatment and Lasix -He is net -3 L since admission -Continue diuresing -Obtain echocardiogram to evaluate for systolic or diastolic dysfunction -Further recommendations pending echocardiogram results -History of COPD could be contributing  2.  Right lower extremity edema -Recent ultrasound with no DVT noted -Antibiotics/treatment of cellulitis as per primary team  3.  History of COPD -Patient advised to stop smoking upon discharge -Management and breathing treatment as per primary team   Signed, Debbe Odea, MD  06/15/2019 4:09 PM

## 2019-06-15 NOTE — ED Notes (Signed)
Pt is AOx4, NAD noted. Pt educated on vistor policy in ED and inpatient floors. Pt verbalizes understanding.

## 2019-06-15 NOTE — H&P (Signed)
[facility] [place] NAME: Belinda Young    MR#:  [id number]  DATE OF BIRTH:  March 14, 1966  DATE OF ADMISSION:  06/14/2019  PRIMARY CARE PHYSICIAN: Freddy Finner, NP   REQUESTING/REFERRING PHYSICIAN: Arta Silence, MD  CHIEF COMPLAINT:   Chief Complaint  Patient presents with  . Shortness of Breath  . Leg Pain    HISTORY OF PRESENT ILLNESS:  Belinda Young  is a 54 y.o. Caucasian female with a known history of COPD, type diabetes mellitus and morbid obesity, who presented to the emergency room with acute onset of worsening dyspnea over the last couple weeks with associated orthopnea and paroxysmal nocturnal dyspnea as well as worsening lower extremity edema.  She admitted to dry cough as well as wheezing.  She denied any fever or chills.  She has been smoking cigarettes until couple weeks ago.  No nausea or vomiting or abdominal pain.  No dysuria, oliguria or hematuria or flank pain.  She has been recently treated for right lower extremity cellulitis with p.o. Bactrim and Keflex with significant improvement.  Upon presentation to the emergency room, blood pressure was 146/75 with pulse oximetry of 86-87% on room air and 94% on 2 L O2 by nasal cannula otherwise unremarkable vital signs.  Labs revealed BNP of 94, high-sensitivity troponin I of 27 and CBC with WBC of 11.1.  COVID-19 PCR is currently pending.  Two-view chest x-ray showed early CHF.  The patient had a negative right lower extremity venous Doppler for DVT on 06/06/2019.  EKG showedEKG showed normal sinus rhythm with a rate of 86 with low voltage QRS and poor R wave progression as well as Q waves in lead III.  The patient was given 40 mg of IV Lasix and immobilizer butyryl.  She will be admitted to a telemetry bed for further evaluation and management. PAST MEDICAL HISTORY:   Past Medical History:  Diagnosis Date  . Cellulitis of trunk   . COPD (chronic obstructive pulmonary disease) ([facility])   . Diabetes  mellitus   . Pedal edema     PAST SURGICAL HISTORY:   Past Surgical History:  Procedure Laterality Date  . CESAREAN SECTION    . MASS EXCISION     spider bite in pubic area    SOCIAL HISTORY:   Social History   Tobacco Use  . Smoking status: Current Every Day Smoker    Packs/day: 0.25    Types: Cigarettes  . Smokeless tobacco: Never Used  Substance Use Topics  . Alcohol use: Yes    FAMILY HISTORY:   Family History  Problem Relation Age of Onset  . COPD Mother   . Heart failure Mother   . Diabetes Mother   . Aneurysm Father     DRUG ALLERGIES:  No Known Allergies  REVIEW OF SYSTEMS:   ROS As per history of present illness. All pertinent systems were reviewed above. Constitutional,  HEENT, cardiovascular, respiratory, GI, GU, musculoskeletal, neuro, psychiatric, endocrine,  integumentary and hematologic systems were reviewed and are otherwise  negative/unremarkable except for positive findings mentioned above in the HPI.   MEDICATIONS AT HOME:   Prior to Admission medications   Medication Sig Start Date End Date Taking? Authorizing Provider  albuterol (PROVENTIL HFA;VENTOLIN HFA) 108 (90 BASE) MCG/ACT inhaler Inhale 2 puffs into the lungs every 6 (six) hours as needed. For SOB   Yes [provider]  albuterol (PROVENTIL) (2.5 MG/3ML) 0.083% nebulizer solution Take 2.5 mg by nebulization every  6 (six) hours as needed. For SOB   Yes [provider]  beclomethasone (QVAR) 80 MCG/ACT inhaler Inhale 1 puff into the lungs 2 (two) times daily.   Yes [provider]  cephALEXin (KEFLEX) 500 MG capsule Take 500 mg by mouth 4 (four) times daily.   Yes [provider]  Fluticasone-Salmeterol (ADVAIR) 250-50 MCG/DOSE AEPB Inhale 1 puff into the lungs 2 (two) times daily.   Yes [provider]  furosemide (LASIX) 20 MG tablet Take 20 mg by mouth daily.   Yes [provider]  gabapentin (NEURONTIN) 300 MG capsule Take 300  mg by mouth at bedtime.   Yes [provider]  glipiZIDE (GLUCOTROL) 5 MG tablet Take 5 mg by mouth daily before breakfast.   Yes [provider]  Insulin Aspart (NOVOLOG FLEXPEN [place]) Inject 20 Units into the skin with breakfast, with lunch, and with evening meal.    Yes [provider]  insulin NPH Human (NOVOLIN N) 100 UNIT/ML injection Inject 15 Units into the skin in the morning and at bedtime.   Yes [provider]  quinapril (ACCUPRIL) 10 MG tablet Take 20 mg by mouth at bedtime.    Yes [provider]  sulfamethoxazole-trimethoprim (BACTRIM DS) 800-160 MG tablet Take 1 tablet by mouth 2 (two) times daily.   Yes [provider]  ibuprofen (ADVIL,MOTRIN) 200 MG tablet Take 400 mg by mouth every 6 (six) hours as needed. For pain    [provider]      VITAL SIGNS:  Blood pressure (!) 146/75, pulse 82, temperature 98.6 F (37 C), temperature source Oral, resp. rate 20, height 5\' 7"  (1.702 m), weight 127 kg, last menstrual period 11/02/2014, SpO2 94 %.  PHYSICAL EXAMINATION:  Physical Exam  GENERAL:  54 y.o.-year-old Caucasian female patient lying in the bed with mild respiratory distress with conversational dyspnea EYES: Pupils equal, round, reactive to light and accommodation. No scleral icterus. Extraocular muscles intact.  HEENT: Head atraumatic, normocephalic. Oropharynx and nasopharynx clear.  NECK:  Supple, no jugular venous distention. No thyroid enlargement, no tenderness.  LUNGS: Diminished bibasilar breath sounds with bibasal rales.  CARDIOVASCULAR: Regular rate and rhythm, S1, S2 normal. No murmurs, rubs, or gallops.  ABDOMEN: Soft, nondistended, nontender. Bowel sounds present. No organomegaly or mass.  EXTREMITIES: 2+ lower extremity pitting edema with no cyanosis, or clubbing.  NEUROLOGIC: Cranial nerves II through XII are intact. Muscle strength 5/5 in all extremities. Sensation intact. Gait not checked.   PSYCHIATRIC: The patient is alert and oriented x 3.  Normal affect and good eye contact. SKIN: No obvious rash, lesion, or ulcer.   LABORATORY PANEL:   CBC Recent Labs  Lab 06/14/19 1923  WBC 11.1*  HGB 13.7  HCT 47.0*  PLT 300   ------------------------------------------------------------------------------------------------------------------  Chemistries  Recent Labs  Lab 06/14/19 1923  NA 135  K 4.5  CL 92*  CO2 33*  GLUCOSE 135*  BUN 14  CREATININE 0.95  CALCIUM 9.3   ------------------------------------------------------------------------------------------------------------------  Cardiac Enzymes No results for input(s): TROPONINI in the last 168 hours. ------------------------------------------------------------------------------------------------------------------  RADIOLOGY:  DG Chest 2 View  Result Date: 06/14/2019 CLINICAL DATA:  Shortness of breath for 2 weeks EXAM: CHEST - 2 VIEW COMPARISON:  11/27/2014 FINDINGS: Cardiac shadow is at the upper limits of normal in size. Mild vascular congestion is seen with mild interstitial edema. No sizable effusion is noted. No bony abnormality is noted. IMPRESSION: Early CHF. Electronically Signed   By: 11/29/2014.D.  On: 06/14/2019 19:51    IMPRESSION AND PLAN:   1.  Acute new onset CHF, likely diastolic.   The patient will be admitted to a telemetry bed and will be diuresed with IV Lasix.   -Will follow serial cardiac enzymes. - Will obtain a 2D echo and a cardiology consultation in a.m. -I notified Dr. Elberta Fortis about the patient.  2.  COPD probably with mild exacerbation. -She will be placed on scheduled as needed duo nebs. -I do not believe that she currently needs steroids. 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 -We will hold off her Advair discus but continue her Qvar MDI.  3.  Tobacco abuse. -She stated that she quit couple of weeks ago. -I strongly advised not to go back to smoking.   4.  Type 2 diabetes mellitus. -The patient will be placed on supplemental coverage with NovoLog. -We will continue her Glucotrol.  5.  Hypertension. -We will continue her Accupril.  6.  DVT prophylaxis. -Subcutaneous Lovenox   All the records are reviewed and case discussed with ED provider. The plan of care was discussed in details with the patient (and family). I answered all questions. The patient agreed to proceed with the above mentioned plan. Further management will depend upon hospital course.   CODE STATUS: Full code  TOTAL TIME TAKING CARE OF THIS PATIENT: 55 minutes.    Hannah Beat M.D on 06/15/2019 at 3:03 AM  [facility]   From 7 PM-7 AM, contact night-coverage [email or web address]  CC: Primary care physician; Sandrea Hughs, NP   Note: This dictation was prepared with Dragon dictation along with smaller phrase technology. Any transcriptional errors that result from this process are unintentional.

## 2019-06-15 NOTE — Progress Notes (Addendum)
PROGRESS NOTE    [name]  ESL:[id number] DOB: 1965-06-14 DOA: 06/14/2019 PCP: Freddy Finner, NP   Brief Narrative:  Belinda Young  is Belinda Young 54 y.o. Caucasian female with Hulan Szumski known history of COPD, type diabetes mellitus and morbid obesity, who presented to the emergency room with acute onset of worsening dyspnea over the last couple weeks with associated orthopnea and paroxysmal nocturnal dyspnea as well as worsening lower extremity edema.  She admitted to dry cough as well as wheezing.  She was admitted for new heart failure.  Assessment & Plan:   Active Problems:   Acute CHF (congestive heart failure) ([place])  1.  Acute new onset CHF - Follow echo - continue 40 IV lasix BID - troponins minimally elevated, likely demand ischemia from HF exacerbation - Consider cardiology c/s as needed - I/O, daily weights - 3/12 CXR with early CHF -> repeat CXR on 3/14  2.  COPD probably with mild exacerbation. -She will be placed on scheduled as needed duo nebs. 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 -We will hold off her Advair discus but continue her Qvar MDI.  3.  Tobacco abuse. -She stated that she quit couple of weeks ago. -encourage cessation  4.  Type 2 diabetes mellitus. -The patient will be placed on supplemental coverage with NovoLog. -Hold oral home meds  5.  Hypertension. -We will continue her lisinopril  DVT prophylaxis: lovenox Code Status: full Family Communication: none at bedside Disposition Plan:  . Patient came from: home            . Anticipated d/c place: home . Barriers to d/c OR conditions which need to be met to effect Belinda Young safe d/c: pending improvement in resp status   Consultants:   none  Procedures:  none  Antimicrobials:  Anti-infectives (From admission, onward)   None     Subjective: Feeling better with lasix, less swollen   Objective: Vitals:   06/15/19 0915 06/15/19 0945 06/15/19 1100 06/15/19 1153   BP: (!) 127/55 (!) 120/59    Pulse: 87 86 81   Resp: 13 17 16 18   Temp:      TempSrc:      SpO2: 92% 91% 97% 97%  Weight:      Height:        Intake/Output Summary (Last 24 hours) at 06/15/2019 1231 Last data filed at 06/15/2019 1153 Gross per 24 hour  Intake 3 ml  Output 3200 ml  Net -3197 ml   Filed Weights   06/14/19 1913  Weight: 127 kg    Examination:  General exam: Appears calm and comfortable  Respiratory system: scattered wheezes Cardiovascular system: S1 & S2 heard, RRR.  Gastrointestinal system: Abdomen is nondistended, soft and nontender Central nervous system: Alert and oriented. No focal neurological deficits. Extremities: bilateral LE edema Skin: No rashes, lesions or ulcers Psychiatry: Judgement and insight appear normal. Mood & affect appropriate.     Data Reviewed: I have personally reviewed following labs and imaging studies  CBC: Recent Labs  Lab 06/14/19 1923  WBC 11.1*  NEUTROABS 8.1*  HGB 13.7  HCT 47.0*  MCV 89.5  PLT 211   Basic Metabolic Panel: Recent Labs  Lab 06/14/19 1923 06/15/19 0410  NA 135 136  K 4.5 4.2  CL 92* 90*  CO2 33* 35*  GLUCOSE 135* 192*  BUN 14 14  CREATININE 0.95 1.02*  CALCIUM 9.3 9.1   GFR: Estimated Creatinine Clearance: 90.4 mL/min (Elise Gladden) (  by C-G formula based on SCr of 1.02 mg/dL (H)). Liver Function Tests: No results for input(s): AST, ALT, ALKPHOS, BILITOT, PROT, ALBUMIN in the last 168 hours. No results for input(s): LIPASE, AMYLASE in the last 168 hours. No results for input(s): AMMONIA in the last 168 hours. Coagulation Profile: No results for input(s): INR, PROTIME in the last 168 hours. Cardiac Enzymes: No results for input(s): CKTOTAL, CKMB, CKMBINDEX, TROPONINI in the last 168 hours. BNP (last 3 results) No results for input(s): PROBNP in the last 8760 hours. HbA1C: No results for input(s): HGBA1C in the last 72 hours. CBG: No results for input(s): GLUCAP in the last 168 hours. Lipid  Profile: No results for input(s): CHOL, HDL, LDLCALC, TRIG, CHOLHDL, LDLDIRECT in the last 72 hours. Thyroid Function Tests: No results for input(s): TSH, T4TOTAL, FREET4, T3FREE, THYROIDAB in the last 72 hours. Anemia Panel: No results for input(s): VITAMINB12, FOLATE, FERRITIN, TIBC, IRON, RETICCTPCT in the last 72 hours. Sepsis Labs: No results for input(s): PROCALCITON, LATICACIDVEN in the last 168 hours.  Recent Results (from the past 240 hour(s))  SARS CORONAVIRUS 2 (TAT 6-24 HRS) Nasopharyngeal Nasopharyngeal Swab     Status: None   Collection Time: 06/14/19 11:08 PM   Specimen: Nasopharyngeal Swab  Result Value Ref Range Status   SARS Coronavirus 2 NEGATIVE NEGATIVE Final    Comment: (NOTE) SARS-CoV-2 target nucleic acids are NOT DETECTED. The SARS-CoV-2 RNA is generally detectable in upper and lower respiratory specimens during the acute phase of infection. Negative results do not preclude SARS-CoV-2 infection, do not rule out co-infections with other pathogens, and should not be used as the sole basis for treatment or other patient management decisions. Negative results must be combined with clinical observations, patient history, and epidemiological information. The expected result is Negative. Fact Sheet for Patients: SugarRoll.be Fact Sheet for Healthcare Providers: https://www.woods-mathews.com/ This test is not yet approved or cleared by the [place] FDA and  has been authorized for detection and/or diagnosis of SARS-CoV-2 by FDA under an Emergency Use Authorization (EUA). This EUA will remain  in effect (meaning this test can be used) for the duration of the COVID-19 declaration under Section 56 4(b)(1) of the Act, 21 U.S.C. section 360bbb-3(b)(1), unless the authorization is terminated or revoked sooner. Performed at [facility], [place] [address], [place], [place] [address]          Radiology Studies: DG  Chest 2 View  Result Date: 06/14/2019 CLINICAL DATA:  Shortness of breath for 2 weeks EXAM: CHEST - 2 VIEW COMPARISON:  11/27/2014 FINDINGS: Cardiac shadow is at the upper limits of normal in size. Mild vascular congestion is seen with mild interstitial edema. No sizable effusion is noted. No bony abnormality is noted. IMPRESSION: Early CHF. Electronically Signed   By: Inez Catalina M.D.   On: 06/14/2019 19:51        Scheduled Meds: . budesonide  2 mL Inhalation BID  . enoxaparin (LOVENOX) injection  40 mg Subcutaneous Q24H  . furosemide  40 mg Intravenous Q12H  . gabapentin  300 mg Oral QHS  . glipiZIDE  5 mg Oral QAC breakfast  . insulin detemir  12 Units Subcutaneous BID AC & HS  . ipratropium-albuterol  3 mL Nebulization QID  . lisinopril  10 mg Oral Daily  . sodium chloride flush  3 mL Intravenous Q12H   Continuous Infusions: . sodium chloride       LOS: 1 day    Time spent: over 30 min  Fayrene Helper, MD [facility]   To contact the attending provider between 7A-7P or the covering provider during after hours 7P-7A, please log into the web site [email or web address] and access using universal [place] password for that web site. If you do not have the password, please call the hospital operator.  06/15/2019, 12:31 PM

## 2019-06-15 NOTE — ED Notes (Signed)
Secure chat sent to Dr. Lowell Guitar informing him that pts COVID test came back negative and that I would D/C precautions. Also informed him that pt stated she does not take glipizide any longer so this medication was not given.

## 2019-06-15 NOTE — Social Work (Signed)
TOC CM/SW consult received and reviewed.  Social work consult in progress.    Larwance Rote, MSW, LCSW  [phone] 8am-6pm (weekends)

## 2019-06-15 NOTE — ED Notes (Signed)
Report to Marylene Land, Charity fundraiser. Transportation requested.

## 2019-06-15 NOTE — ED Notes (Signed)
Delay in transportation explained to pt- pt verbalizes understanding.

## 2019-06-15 NOTE — ED Notes (Signed)
NEB treatment complete at this time. Pt switched back to [place] at 3 liters.

## 2019-06-15 NOTE — ED Notes (Signed)
Pt states she normally does not need oxygen at home and is normally able to walk but has been getting very winded with exertion. Pt states she is feeling progressively better as fluid is removed and w/ breathing treatments. Pt aox4, nad noted, no wheeze/rhonchi noted at this time.

## 2019-06-15 NOTE — ED Notes (Signed)
Pt noted to desat when moving from chair to bedside commode. Pt returns to normal SpO2 after resting.

## 2019-06-15 NOTE — ED Notes (Signed)
Pt given meal tray  Pt SpO2 at 91%, pt increased to 3 liters via [place]

## 2019-06-15 NOTE — ED Notes (Signed)
Gwynn from lab called stating that there was not enough urine to do Hcg on pt. Informed her that I had not sent urine for Hcg, per Gwynn order is from 0645 this am.

## 2019-06-15 NOTE — TOC Initial Note (Signed)
Transition of Care [facility]) - Initial/Assessment Note    Patient Details  Name: Belinda Young MRN: [id number] Date of Birth: 1966/03/15  [facility] CM/SW Contact:    Larwance Rote, LCSW Phone Number: 06/15/2019, 12:07 PM  Clinical Narrative:  The patient declined SNF. Patient reported that she is able to attend to all ADL's however would like assistance with home health.     Plan: Patient will discharge home with home w/home health agency.   Social worker will provide patient w/approce list of home health agencies.   Expected Discharge Plan: Home w Home Health Services Barriers to Discharge: (Local [facility] agencies not accepting Medicaid patients for CNA)   Patient Goals and CMS Choice Patient states their goals for this hospitalization and ongoing recovery are:: Would like Home Health assisstance. CMS Medicare.gov Compare Post Acute Care list provided to:: Patient Choice offered to / list presented to : Patient  Expected Discharge Plan and Services Expected Discharge Plan: Home w Home Health Services In-house Referral: Clinical Social Work   Post Acute Care Choice: Home Health Living arrangements for the past 2 months: Single Family Home         HH Arranged: Nurse's Aide    Prior Living Arrangements/Services Living arrangements for the past 2 months: Single Family Home Lives with:: Self Patient language and need for interpreter reviewed:: No Do you feel safe going back to the place where you live?: Yes      Need for Family Participation in Patient Care: No (Comment) Care giver support system in place?: Yes (comment)   Criminal Activity/Legal Involvement Pertinent to Current Situation/Hospitalization: No - Comment as needed  Activities of Daily Living      Permission Sought/Granted Permission sought to share information with : Case Manager, Magazine features editor Permission granted to share information with : Yes, Verbal Permission Granted      Permission granted to share info w AGENCY: Home Health agency in the [place]        Emotional Assessment Appearance:: Other (Comment Required(Telephone assessment/unable to assess)     Orientation: : Oriented to Self, Oriented to Place, Oriented to  Time, Oriented to Situation Alcohol / Substance Use: Not Applicable Psych Involvement: No (comment)  Admission diagnosis:  Acute CHF (congestive heart failure) (HCC) [I50.9] Patient Active Problem List   Diagnosis Date Noted  . Acute CHF (congestive heart failure) (HCC) 06/14/2019  . Sepsis due to cellulitis [facility])    PCP:  Sandrea Hughs, NP Pharmacy:   [facility] - [place], [place] - [address] [place] [address] Phone: [phone] Fax: [phone]   Social Determinants of Health (SDOH) Interventions   Readmission Risk Interventions No flowsheet data found.

## 2019-06-15 NOTE — ED Notes (Signed)
Pt moved to recliner to make her more comfortable.  Pt states that the bed is making her back hurt

## 2019-06-16 ENCOUNTER — Inpatient Hospital Stay: Payer: Medicaid Other

## 2019-06-16 ENCOUNTER — Inpatient Hospital Stay (HOSPITAL_COMMUNITY)
Admit: 2019-06-16 | Discharge: 2019-06-16 | Disposition: A | Discharge status: Home / Self Care | Payer: Medicaid Other | Attending: Family Medicine | Admitting: Family Medicine

## 2019-06-16 DIAGNOSIS — I5031 Acute diastolic (congestive) heart failure: Secondary | ICD-10-CM

## 2019-06-16 DIAGNOSIS — I5030 Unspecified diastolic (congestive) heart failure: Secondary | ICD-10-CM

## 2019-06-16 LAB — CBC WITH DIFFERENTIAL/PLATELET
Abs Immature Granulocytes: 0.04 10*3/uL (ref 0.00–0.07)
Basophils Absolute: 0.1 10*3/uL (ref 0.0–0.1)
Basophils Relative: 1 %
Eosinophils Absolute: 0.5 10*3/uL (ref 0.0–0.5)
Eosinophils Relative: 5 %
HCT: 46.2 % — ABNORMAL HIGH (ref 36.0–46.0)
Hemoglobin: 13.8 g/dL (ref 12.0–15.0)
Immature Granulocytes: 0 %
Lymphocytes Relative: 21 %
Lymphs Abs: 2.1 10*3/uL (ref 0.7–4.0)
MCH: 26.4 pg (ref 26.0–34.0)
MCHC: 29.9 g/dL — ABNORMAL LOW (ref 30.0–36.0)
MCV: 88.3 fL (ref 80.0–100.0)
Monocytes Absolute: 0.6 10*3/uL (ref 0.1–1.0)
Monocytes Relative: 6 %
Neutro Abs: 6.8 10*3/uL (ref 1.7–7.7)
Neutrophils Relative %: 67 %
Platelets: 315 10*3/uL (ref 150–400)
RBC: 5.23 MIL/uL — ABNORMAL HIGH (ref 3.87–5.11)
RDW: 16.2 % — ABNORMAL HIGH (ref 11.5–15.5)
WBC: 10.1 10*3/uL (ref 4.0–10.5)
nRBC: 0 % (ref 0.0–0.2)

## 2019-06-16 LAB — COMPREHENSIVE METABOLIC PANEL
ALT: 17 U/L (ref 0–44)
AST: 14 U/L — ABNORMAL LOW (ref 15–41)
Albumin: 3.2 g/dL — ABNORMAL LOW (ref 3.5–5.0)
Alkaline Phosphatase: 81 U/L (ref 38–126)
Anion gap: 11 (ref 5–15)
BUN: 20 mg/dL (ref 6–20)
CO2: 39 mmol/L — ABNORMAL HIGH (ref 22–32)
Calcium: 8.9 mg/dL (ref 8.9–10.3)
Chloride: 88 mmol/L — ABNORMAL LOW (ref 98–111)
Creatinine, Ser: 0.89 mg/dL (ref 0.44–1.00)
GFR calc Af Amer: >60 mL/min (ref >60)
GFR calc non Af Amer: >60 mL/min (ref >60)
Glucose, Bld: 129 mg/dL — ABNORMAL HIGH (ref 70–99)
Potassium: 3.9 mmol/L (ref 3.5–5.1)
Sodium: 138 mmol/L (ref 135–145)
Total Bilirubin: 0.7 mg/dL (ref 0.3–1.2)
Total Protein: 6.5 g/dL (ref 6.5–8.1)

## 2019-06-16 LAB — GLUCOSE, CAPILLARY
Glucose-Capillary: 127 mg/dL — ABNORMAL HIGH (ref 70–99)
Glucose-Capillary: 147 mg/dL — ABNORMAL HIGH (ref 70–99)
Glucose-Capillary: 223 mg/dL — ABNORMAL HIGH (ref 70–99)
Glucose-Capillary: 304 mg/dL — ABNORMAL HIGH (ref 70–99)
Glucose-Capillary: 341 mg/dL — ABNORMAL HIGH (ref 70–99)

## 2019-06-16 LAB — ECHOCARDIOGRAM COMPLETE
Height: 67 in
Weight: 4344 oz

## 2019-06-16 LAB — PHOSPHORUS: Phosphorus: 4.6 mg/dL (ref 2.5–4.6)

## 2019-06-16 LAB — BRAIN NATRIURETIC PEPTIDE: B Natriuretic Peptide: 51 pg/mL (ref 0.0–100.0)

## 2019-06-16 LAB — MAGNESIUM: Magnesium: 2 mg/dL (ref 1.7–2.4)

## 2019-06-16 LAB — HIV ANTIBODY (ROUTINE TESTING W REFLEX): HIV Screen 4th Generation wRfx: NONREACTIVE

## 2019-06-16 MED ORDER — INSULIN ASPART 100 UNIT/ML ~~LOC~~ SOLN
0.0000 [IU] | Freq: Every day | SUBCUTANEOUS | Status: DC
  Administered 2019-06-16: 4 [IU] via SUBCUTANEOUS
  Filled 2019-06-16: qty 1

## 2019-06-16 MED ORDER — AMOXICILLIN-POT CLAVULANATE 875-125 MG PO TABS
1.0000 | ORAL_TABLET | Freq: Two times a day (BID) | ORAL | Status: DC
  Administered 2019-06-16: 1 via ORAL
  Filled 2019-06-16: qty 1

## 2019-06-16 MED ORDER — INSULIN ASPART 100 UNIT/ML ~~LOC~~ SOLN
4.0000 [IU] | Freq: Three times a day (TID) | SUBCUTANEOUS | Status: DC
  Administered 2019-06-17 (×2): 4 [IU] via SUBCUTANEOUS
  Filled 2019-06-16 (×2): qty 1

## 2019-06-16 MED ORDER — INSULIN ASPART 100 UNIT/ML ~~LOC~~ SOLN
0.0000 [IU] | Freq: Three times a day (TID) | SUBCUTANEOUS | Status: DC
  Administered 2019-06-17: 4 [IU] via SUBCUTANEOUS
  Administered 2019-06-17: 7 [IU] via SUBCUTANEOUS
  Filled 2019-06-16 (×2): qty 1

## 2019-06-16 NOTE — Progress Notes (Signed)
PROGRESS NOTE    Belinda Young  QHU:[id number] DOB: 1965-09-10 DOA: 06/14/2019 PCP: Freddy Finner, NP   Brief Narrative:  Belinda Young  is Belinda Young 54 y.o. Caucasian female with Belinda Young known history of COPD, type diabetes mellitus and morbid obesity, who presented to the emergency room with acute onset of worsening dyspnea over the last couple weeks with associated orthopnea and paroxysmal nocturnal dyspnea as well as worsening lower extremity edema.  She admitted to dry cough as well as wheezing.  She was admitted for new heart failure.  Assessment & Plan:   Active Problems:   Acute CHF (congestive heart failure) ([place])  1.  Acute new onset CHF - Follow echo - continue 40 IV lasix BID - troponins minimally elevated, likely demand ischemia from HF exacerbation - cardiology c/s, appreciate recs - IV diuresis x 1 day, follow echo, hold lisinopril for now  - echo with normal EF, normal diastolic parameters - I/O, daily weights - net negative 4.8 L, weight down to 123.2 kg  - 3/12 CXR with early CHF -> repeat CXR on 3/14 with improved CHF  2.  COPD probably with mild exacerbation. -She will be placed on scheduled as needed duo nebs. 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 -We will hold off her Advair discus but continue her Qvar MDI.  # RLE Cellulitis: improved, continue to monitor - started on augmentin overnight   3.  Tobacco abuse. -She stated that she quit couple of weeks ago. -encourage cessation  4.  Type 2 diabetes mellitus. -The patient will be placed on supplemental coverage with NovoLog. -Hold oral home meds  5.  Hypertension. -We will continue her lisinopril - on hold  DVT prophylaxis: lovenox Code Status: full Family Communication: none at bedside - daughter at bedside Disposition Plan:  . Patient came from: home            . Anticipated d/c place: home . Barriers to d/c OR conditions which need to be met to effect Belinda Young safe d/c: pending  improvement in resp status   Consultants:   none  Procedures:  Echo IMPRESSIONS    1. Left ventricular ejection fraction, by estimation, is 60 to 65%. The  left ventricle has normal function. The left ventricle has no regional  wall motion abnormalities. Left ventricular diastolic parameters were  normal.  2. Right ventricular systolic function is normal. The right ventricular  size is mildly enlarged.  3. Right atrial size was mildly dilated.  4. The mitral valve is normal in structure. No evidence of mitral valve  regurgitation. No evidence of mitral stenosis.  5. The aortic valve is normal in structure. Aortic valve regurgitation is  not visualized. No aortic stenosis is present.   Antimicrobials:  Anti-infectives (From admission, onward)   Start     Dose/Rate Route Frequency Ordered Stop   06/16/19 1000  amoxicillin-clavulanate (AUGMENTIN) 875-125 MG per tablet 1 tablet  Status:  Discontinued     1 tablet Oral Every 12 hours 06/16/19 0456 06/16/19 1616     Subjective: Feeling ebtter, no new complaints  Objective: Vitals:   06/16/19 0355 06/16/19 0819 06/16/19 1125 06/16/19 1210  BP: (!) 105/32 (!) 102/47 (!) 123/58   Pulse: 84 87 85   Resp: 20 17    Temp: 98.7 F (37.1 C) 97.7 F (36.5 C) 98.5 F (36.9 C)   TempSrc: Oral  Oral   SpO2: 90% 90% 90% 90%  Weight: 123.2 kg  Height:        Intake/Output Summary (Last 24 hours) at 06/16/2019 1624 Last data filed at 06/16/2019 1330 Gross per 24 hour  Intake 720 ml  Output 2350 ml  Net -1630 ml   Filed Weights   06/14/19 1913 06/15/19 1853 06/16/19 0355  Weight: 127 kg 124.1 kg 123.2 kg    Examination:  General: No acute distress. Cardiovascular: Heart sounds show Belinda Young regular rate, and rhythm.  Lungs: Clear to auscultation bilaterally  Abdomen: Soft, nontender, nondistended  Neurological: Alert and oriented 3. Moves all extremities 4. Cranial nerves II through XII grossly intact. Skin: Warm and  dry. No rashes or lesions. Extremities: improved LE edema     Data Reviewed: I have personally reviewed following labs and imaging studies  CBC: Recent Labs  Lab 06/14/19 1923 06/16/19 0459  WBC 11.1* 10.1  NEUTROABS 8.1* 6.8  HGB 13.7 13.8  HCT 47.0* 46.2*  MCV 89.5 88.3  PLT 300 376   Basic Metabolic Panel: Recent Labs  Lab 06/14/19 1923 06/15/19 0410 06/16/19 0459  NA 135 136 138  K 4.5 4.2 3.9  CL 92* 90* 88*  CO2 33* 35* 39*  GLUCOSE 135* 192* 129*  BUN 14 14 20   CREATININE 0.95 1.02* 0.89  CALCIUM 9.3 9.1 8.9  MG  --   --  2.0  PHOS  --   --  4.6   GFR: Estimated Creatinine Clearance: 101.8 mL/min (by C-G formula based on SCr of 0.89 mg/dL). Liver Function Tests: Recent Labs  Lab 06/16/19 0459  AST 14*  ALT 17  ALKPHOS 81  BILITOT 0.7  PROT 6.5  ALBUMIN 3.2*   No results for input(s): LIPASE, AMYLASE in the last 168 hours. No results for input(s): AMMONIA in the last 168 hours. Coagulation Profile: No results for input(s): INR, PROTIME in the last 168 hours. Cardiac Enzymes: No results for input(s): CKTOTAL, CKMB, CKMBINDEX, TROPONINI in the last 168 hours. BNP (last 3 results) No results for input(s): PROBNP in the last 8760 hours. HbA1C: Recent Labs    06/15/19 1508  HGBA1C 7.7*   CBG: Recent Labs  Lab 06/15/19 1609 06/15/19 2109 06/16/19 0032 06/16/19 0813 06/16/19 1125  GLUCAP 258* 225* 147* 127* 223*   Lipid Profile: No results for input(s): CHOL, HDL, LDLCALC, TRIG, CHOLHDL, LDLDIRECT in the last 72 hours. Thyroid Function Tests: No results for input(s): TSH, T4TOTAL, FREET4, T3FREE, THYROIDAB in the last 72 hours. Anemia Panel: No results for input(s): VITAMINB12, FOLATE, FERRITIN, TIBC, IRON, RETICCTPCT in the last 72 hours. Sepsis Labs: No results for input(s): PROCALCITON, LATICACIDVEN in the last 168 hours.  Recent Results (from the past 240 hour(s))  SARS CORONAVIRUS 2 (TAT 6-24 HRS) Nasopharyngeal Nasopharyngeal  Swab     Status: None   Collection Time: 06/14/19 11:08 PM   Specimen: Nasopharyngeal Swab  Result Value Ref Range Status   SARS Coronavirus 2 NEGATIVE NEGATIVE Final    Comment: (NOTE) SARS-CoV-2 target nucleic acids are NOT DETECTED. The SARS-CoV-2 RNA is generally detectable in upper and lower respiratory specimens during the acute phase of infection. Negative results do not preclude SARS-CoV-2 infection, do not rule out co-infections with other pathogens, and should not be used as the sole basis for treatment or other patient management decisions. Negative results must be combined with clinical observations, patient history, and epidemiological information. The expected result is Negative. Fact Sheet for Patients: SugarRoll.be Fact Sheet for Healthcare Providers: https://www.woods-mathews.com/ This test is not yet approved or cleared by the  [place] Architectural technologist and  has been authorized for detection and/or diagnosis of SARS-CoV-2 by FDA under an Print production planner (EUA). This EUA will remain  in effect (meaning this test can be used) for the duration of the COVID-19 declaration under Section 56 4(b)(1) of the Act, 21 U.S.C. section 360bbb-3(b)(1), unless the authorization is terminated or revoked sooner. Performed at [facility], [place] [address], [place], [place] [address]          Radiology Studies: DG Chest 2 View  Result Date: 06/14/2019 CLINICAL DATA:  Shortness of breath for 2 weeks EXAM: CHEST - 2 VIEW COMPARISON:  11/27/2014 FINDINGS: Cardiac shadow is at the upper limits of normal in size. Mild vascular congestion is seen with mild interstitial edema. No sizable effusion is noted. No bony abnormality is noted. IMPRESSION: Early CHF. Electronically Signed   By: Inez Catalina M.D.   On: 06/14/2019 19:51   DG Chest Port 1 View  Result Date: 06/16/2019 CLINICAL DATA:  Heart failure per provider. Hx of COPD, DM,  current smoker. Follow-up exam. EXAM: PORTABLE CHEST 1 VIEW COMPARISON:  06/14/2019 FINDINGS: Cardiac silhouette is normal in size. No mediastinal or hilar masses. There is vascular congestion without overt pulmonary edema. Mild interstitial thickening noted on the prior study has improved. No lung consolidation. No convincing pleural effusion and no pneumothorax. Skeletal structures are grossly intact. IMPRESSION: 1. Mild CHF suggested on the prior study has improved/resolved. 2. No acute cardiopulmonary disease. Electronically Signed   By: Lajean Manes M.D.   On: 06/16/2019 08:24   ECHOCARDIOGRAM COMPLETE  Result Date: 06/16/2019    ECHOCARDIOGRAM REPORT   Patient Name:   Belinda Young [place] Date of Exam: 06/16/2019 Medical Rec #:  [id number]    Height:       67.0 in Accession #:    [id number]   Weight:       271.5 lb Date of Birth:  November 24, 1965    BSA:          2.303 m Patient Age:    76 years     BP:           105/32 mmHg Patient Gender: F            HR:           84 bpm. Exam Location:  [facility] Procedure: 2D Echo Indications:     Diastolic CHF  History:         Patient has no prior history of Echocardiogram examinations.                  COPD; Risk Factors:Morbid Obesity and Diabetes.  Sonographer:     LTM Referring Phys:  [id number] [place] Diagnosing Phys: Kate Sable MD IMPRESSIONS  1. Left ventricular ejection fraction, by estimation, is 60 to 65%. The left ventricle has normal function. The left ventricle has no regional wall motion abnormalities. Left ventricular diastolic parameters were normal.  2. Right ventricular systolic function is normal. The right ventricular size is mildly enlarged.  3. Right atrial size was mildly dilated.  4. The mitral valve is normal in structure. No evidence of mitral valve regurgitation. No evidence of mitral stenosis.  5. The aortic valve is normal in structure. Aortic valve regurgitation is not visualized. No aortic stenosis is present. FINDINGS  Left Ventricle: Left  ventricular ejection fraction, by estimation, is 60 to 65%. The left ventricle has normal function. The left ventricle has no regional wall motion abnormalities.  The left ventricular internal cavity size was normal in size. There is  no left ventricular hypertrophy. Left ventricular diastolic parameters were normal. Right Ventricle: The right ventricular size is mildly enlarged. No increase in right ventricular wall thickness. Right ventricular systolic function is normal. Left Atrium: Left atrial size was normal in size. Right Atrium: Right atrial size was mildly dilated. Pericardium: There is no evidence of pericardial effusion. Mitral Valve: The mitral valve is normal in structure. Normal mobility of the mitral valve leaflets. No evidence of mitral valve regurgitation. No evidence of mitral valve stenosis. Tricuspid Valve: The tricuspid valve is grossly normal. Tricuspid valve regurgitation is not demonstrated. No evidence of tricuspid stenosis. Aortic Valve: The aortic valve is normal in structure. Aortic valve regurgitation is not visualized. No aortic stenosis is present. Aortic valve mean gradient measures 5.0 mmHg. Aortic valve peak gradient measures 7.5 mmHg. Aortic valve area, by VTI measures 2.44 cm. Pulmonic Valve: The pulmonic valve was normal in structure. Pulmonic valve regurgitation is not visualized. No evidence of pulmonic stenosis. Aorta: The aortic root is normal in size and structure. Venous: The inferior vena cava was not well visualized. IAS/Shunts: No atrial level shunt detected by color flow Doppler.  LEFT VENTRICLE PLAX 2D LVIDd:         4.90 cm  Diastology LVIDs:         3.10 cm  LV e' lateral:   9.46 cm/s LV PW:         1.13 cm  LV E/e' lateral: 10.6 LV IVS:        0.97 cm  LV e' medial:    6.74 cm/s LVOT diam:     1.90 cm  LV E/e' medial:  14.8 LV SV:         71 LV SV Index:   31 LVOT Area:     2.84 cm  RIGHT VENTRICLE RV S prime:     15.70 cm/s TAPSE (M-mode): 2.1 cm LEFT ATRIUM              Index       RIGHT ATRIUM           Index LA diam:        3.00 cm 1.30 cm/m  RA Area:     22.10 cm LA Vol (A2C):   34.8 ml 15.11 ml/m RA Volume:   73.60 ml  31.96 ml/m LA Vol (A4C):   60.9 ml 26.45 ml/m LA Biplane Vol: 45.9 ml 19.93 ml/m  AORTIC VALVE                    PULMONIC VALVE AV Area (Vmax):    2.40 cm     PV Vmax:       1.52 m/s AV Area (Vmean):   2.28 cm     PV Vmean:      101.000 cm/s AV Area (VTI):     2.44 cm     PV VTI:        0.292 m AV Vmax:           137.00 cm/s  PV Peak grad:  9.2 mmHg AV Vmean:          103.000 cm/s PV Mean grad:  5.0 mmHg AV VTI:            0.290 m AV Peak Grad:      7.5 mmHg AV Mean Grad:      5.0 mmHg LVOT Vmax:  116.00 cm/s LVOT Vmean:        82.900 cm/s LVOT VTI:          0.250 m LVOT/AV VTI ratio: 0.86  AORTA Ao Root diam: 3.10 cm MITRAL VALVE               TRICUSPID VALVE MV Area (PHT): 4.49 cm    TR Peak grad:   10.8 mmHg MV E velocity: 99.90 cm/s  TR Vmax:        164.00 cm/s MV Belinda Young velocity: 86.30 cm/s MV E/Belinda Young ratio:  1.16        SHUNTS                            Systemic VTI:  0.25 m                            Systemic Diam: 1.90 cm Kate Sable MD Electronically signed by Kate Sable MD Signature Date/Time: 06/16/2019/1:03:30 PM    Final         Scheduled Meds: . budesonide  2 mL Inhalation BID  . enoxaparin (LOVENOX) injection  40 mg Subcutaneous Q24H  . furosemide  40 mg Intravenous BID  . gabapentin  300 mg Oral QHS  . insulin aspart  0-15 Units Subcutaneous TID WC  . insulin aspart  0-5 Units Subcutaneous QHS  . insulin detemir  12 Units Subcutaneous BID AC & HS  . ipratropium-albuterol  3 mL Nebulization QID  . predniSONE  40 mg Oral Q breakfast  . sodium chloride flush  3 mL Intravenous Q12H   Continuous Infusions: . sodium chloride       LOS: 2 days    Time spent: over 30 min    Fayrene Helper, MD [facility]   To contact the attending provider between 7A-7P or the covering provider during  after hours 7P-7A, please log into the web site [email or web address] and access using universal [place] password for that web site. If you do not have the password, please call the hospital operator.  06/16/2019, 4:24 PM

## 2019-06-16 NOTE — Progress Notes (Signed)
Progress Note  Patient Name: Belinda Young Date of Encounter: 06/16/2019  Primary Cardiologist: Kate Sable, MD   Subjective   Patient states feeling better after diuresing.  She is net -1.6 L over the past 24 hours.  Echocardiogram is currently being performed.  We will review echocardiogram.  Blood pressure on the low side.  Inpatient Medications    Scheduled Meds: . amoxicillin-clavulanate  1 tablet Oral Q12H  . budesonide  2 mL Inhalation BID  . enoxaparin (LOVENOX) injection  40 mg Subcutaneous Q24H  . furosemide  40 mg Intravenous BID  . gabapentin  300 mg Oral QHS  . insulin aspart  0-15 Units Subcutaneous TID WC  . insulin aspart  0-5 Units Subcutaneous QHS  . insulin detemir  12 Units Subcutaneous BID AC & HS  . ipratropium-albuterol  3 mL Nebulization QID  . predniSONE  40 mg Oral Q breakfast  . sodium chloride flush  3 mL Intravenous Q12H   Continuous Infusions: . sodium chloride     PRN Meds: sodium chloride, acetaminophen, ALPRAZolam, diphenhydrAMINE, ondansetron (ZOFRAN) IV, sodium chloride flush   Vital Signs    Vitals:   06/15/19 1953 06/15/19 2028 06/16/19 0355 06/16/19 0819  BP: (!) 109/51  (!) 105/32 (!) 102/47  Pulse: 80  84 87  Resp: 18  20 17   Temp: 98.6 F (37 C)  98.7 F (37.1 C) 97.7 F (36.5 C)  TempSrc: Oral  Oral   SpO2: 93% 95% 90% 90%  Weight:   123.2 kg   Height:        Intake/Output Summary (Last 24 hours) at 06/16/2019 1114 Last data filed at 06/16/2019 0950 Gross per 24 hour  Intake 720 ml  Output 2450 ml  Net -1730 ml   Last 3 Weights 06/16/2019 06/15/2019 06/14/2019  Weight (lbs) 271 lb 8 oz 273 lb 8 oz 280 lb  Weight (kg) 123.152 kg 124.059 kg 127.007 kg      Telemetry    Sinus rhythm, heart rate 86- Personally Reviewed  ECG    No new tracing obtained- Personally Reviewed  Physical Exam   GEN: No acute distress.   Neck: No JVD Cardiac: RRR, no murmurs, rubs, or gallops.  Respiratory:  Expiratory wheezing  bilaterally GI: Soft, nontender, non-distended  MS:  Right lower extremity edema. Neuro:  Nonfocal  Psych: Normal affect   Labs    High Sensitivity Troponin:   Recent Labs  Lab 06/14/19 1923 06/15/19 0410  TROPONINIHS 27* 25*      Chemistry Recent Labs  Lab 06/14/19 1923 06/15/19 0410 06/16/19 0459  NA 135 136 138  K 4.5 4.2 3.9  CL 92* 90* 88*  CO2 33* 35* 39*  GLUCOSE 135* 192* 129*  BUN 14 14 20   CREATININE 0.95 1.02* 0.89  CALCIUM 9.3 9.1 8.9  PROT  --   --  6.5  ALBUMIN  --   --  3.2*  AST  --   --  14*  ALT  --   --  17  ALKPHOS  --   --  81  BILITOT  --   --  0.7  GFRNONAA >60 >60 >60  GFRAA >60 >60 >60  ANIONGAP 10 11 11      Hematology Recent Labs  Lab 06/14/19 1923 06/16/19 0459  WBC 11.1* 10.1  RBC 5.25* 5.23*  HGB 13.7 13.8  HCT 47.0* 46.2*  MCV 89.5 88.3  MCH 26.1 26.4  MCHC 29.1* 29.9*  RDW 16.5* 16.2*  PLT  300 315    BNP Recent Labs  Lab 06/14/19 1923 06/16/19 0459  BNP 94.0 51.0     DDimer No results for input(s): DDIMER in the last 168 hours.   Radiology    DG Chest 2 View  Result Date: 06/14/2019 CLINICAL DATA:  Shortness of breath for 2 weeks EXAM: CHEST - 2 VIEW COMPARISON:  11/27/2014 FINDINGS: Cardiac shadow is at the upper limits of normal in size. Mild vascular congestion is seen with mild interstitial edema. No sizable effusion is noted. No bony abnormality is noted. IMPRESSION: Early CHF. Electronically Signed   By: Alcide Clever M.D.   On: 06/14/2019 19:51   DG Chest Port 1 View  Result Date: 06/16/2019 CLINICAL DATA:  Heart failure per provider. Hx of COPD, DM, current smoker. Follow-up exam. EXAM: PORTABLE CHEST 1 VIEW COMPARISON:  06/14/2019 FINDINGS: Cardiac silhouette is normal in size. No mediastinal or hilar masses. There is vascular congestion without overt pulmonary edema. Mild interstitial thickening noted on the prior study has improved. No lung consolidation. No convincing pleural effusion and no  pneumothorax. Skeletal structures are grossly intact. IMPRESSION: 1. Mild CHF suggested on the prior study has improved/resolved. 2. No acute cardiopulmonary disease. Electronically Signed   By: Amie Portland M.D.   On: 06/16/2019 08:24    Cardiac Studies   Echo being performed today.  We will review.  Patient Profile     54 y.o. female with history of diabetes, COPD who is being seen due to shortness of breath on exertion.  Assessment & Plan    1.  Dyspnea on exertion -Symptoms improved with breathing treatment and Lasix -She is net -1.6 L over the past 24 hours. -Continue IV diuresing for at least 1 more day -Echocardiogram being performed.  We will review -Further recommendations pending echocardiogram results -Stop lisinopril for now to give more room for diuresing. -History of COPD could be contributing  2.  Right lower extremity edema -Recent ultrasound with no DVT noted -Antibiotics/treatment of cellulitis as per primary team  3.  History of COPD -Patient advised to stop smoking upon discharge -Management and breathing treatment as per primary team      Signed, Debbe Odea, MD  06/16/2019, 11:14 AM

## 2019-06-16 NOTE — Progress Notes (Signed)
OVERNIGHT Notified by bedside RN patient with redness and edema of right lower extremity now with associated itching.  Review of chart and exam shows patient with improving cellulitis right lower extremity ,and per patient report, panus area as well. She was started on keflex and septra on 3/4 in outpatient setting.  Patient reports to me that her leg is much less edematous and red than it had been prior.  She is afebrile and insignificant WBC elevation on presenting labs. Benadryl was given tonight in which the patient reports was very effective. She has not completed her previously prescribed antibiotics (app 4-5 days remaining of pills left in bottles).  Due to the now associated itching and potentially associated with sulfa based drugs, antimicrobial therapy changed to augmentin for 5 days to complete the treatment for her cellulitis

## 2019-06-17 ENCOUNTER — Telehealth: Payer: Self-pay

## 2019-06-17 DIAGNOSIS — I5033 Acute on chronic diastolic (congestive) heart failure: Secondary | ICD-10-CM

## 2019-06-17 LAB — COMPREHENSIVE METABOLIC PANEL
ALT: 16 U/L (ref 0–44)
AST: 16 U/L (ref 15–41)
Albumin: 3.5 g/dL (ref 3.5–5.0)
Alkaline Phosphatase: 76 U/L (ref 38–126)
Anion gap: 13 (ref 5–15)
BUN: 27 mg/dL — ABNORMAL HIGH (ref 6–20)
CO2: 39 mmol/L — ABNORMAL HIGH (ref 22–32)
Calcium: 9.1 mg/dL (ref 8.9–10.3)
Chloride: 87 mmol/L — ABNORMAL LOW (ref 98–111)
Creatinine, Ser: 0.95 mg/dL (ref 0.44–1.00)
GFR calc Af Amer: >60 mL/min (ref >60)
GFR calc non Af Amer: >60 mL/min (ref >60)
Glucose, Bld: 152 mg/dL — ABNORMAL HIGH (ref 70–99)
Potassium: 3.7 mmol/L (ref 3.5–5.1)
Sodium: 139 mmol/L (ref 135–145)
Total Bilirubin: 0.7 mg/dL (ref 0.3–1.2)
Total Protein: 6.7 g/dL (ref 6.5–8.1)

## 2019-06-17 LAB — CBC WITH DIFFERENTIAL/PLATELET
Abs Immature Granulocytes: 0.04 10*3/uL (ref 0.00–0.07)
Basophils Absolute: 0 10*3/uL (ref 0.0–0.1)
Basophils Relative: 0 %
Eosinophils Absolute: 0.2 10*3/uL (ref 0.0–0.5)
Eosinophils Relative: 2 %
HCT: 47.9 % — ABNORMAL HIGH (ref 36.0–46.0)
Hemoglobin: 14.1 g/dL (ref 12.0–15.0)
Immature Granulocytes: 0 %
Lymphocytes Relative: 26 %
Lymphs Abs: 2.5 10*3/uL (ref 0.7–4.0)
MCH: 25.8 pg — ABNORMAL LOW (ref 26.0–34.0)
MCHC: 29.4 g/dL — ABNORMAL LOW (ref 30.0–36.0)
MCV: 87.6 fL (ref 80.0–100.0)
Monocytes Absolute: 0.7 10*3/uL (ref 0.1–1.0)
Monocytes Relative: 7 %
Neutro Abs: 6.3 10*3/uL (ref 1.7–7.7)
Neutrophils Relative %: 65 %
Platelets: 343 10*3/uL (ref 150–400)
RBC: 5.47 MIL/uL — ABNORMAL HIGH (ref 3.87–5.11)
RDW: 15.8 % — ABNORMAL HIGH (ref 11.5–15.5)
WBC: 9.8 10*3/uL (ref 4.0–10.5)
nRBC: 0 % (ref 0.0–0.2)

## 2019-06-17 LAB — MAGNESIUM: Magnesium: 2.1 mg/dL (ref 1.7–2.4)

## 2019-06-17 LAB — PHOSPHORUS: Phosphorus: 4.6 mg/dL (ref 2.5–4.6)

## 2019-06-17 LAB — GLUCOSE, CAPILLARY
Glucose-Capillary: 155 mg/dL — ABNORMAL HIGH (ref 70–99)
Glucose-Capillary: 249 mg/dL — ABNORMAL HIGH (ref 70–99)

## 2019-06-17 MED ORDER — PREDNISONE 20 MG PO TABS: 40.0000 mg | ORAL_TABLET | Freq: Every day | ORAL | 0 refills | Status: DC

## 2019-06-17 MED ORDER — FUROSEMIDE 40 MG PO TABS: 40.0000 mg | ORAL_TABLET | Freq: Every day | ORAL | Status: DC

## 2019-06-17 MED ORDER — FUROSEMIDE 40 MG PO TABS: 40.0000 mg | ORAL_TABLET | Freq: Every day | ORAL | 0 refills | Status: DC

## 2019-06-17 MED ORDER — AMOXICILLIN-POT CLAVULANATE 875-125 MG PO TABS: 1.0000 | ORAL_TABLET | Freq: Two times a day (BID) | ORAL | 0 refills | Status: AC

## 2019-06-17 NOTE — Telephone Encounter (Addendum)
---  TCM....  Patient is being discharged      They are scheduled to see  Alycia Rossetti 3/25 at 10 am     They need to be seen within 1-2 weeks      Please call

## 2019-06-17 NOTE — Discharge Summary (Signed)
Physician Discharge Summary  Belinda RuthsKathy L Young WUJ:[id number] DOB: Aug 20, 1965 DOA: 06/14/2019  PCP: Sandrea Hughsubio, Jessica, NP  Admit date: 06/14/2019 Discharge date: 06/19/2019  Time spent: 40 minutes  Recommendations for Outpatient Follow-up:  1. Follow outpatient CBC/CMP 2. Follow volume status outpatient 3. Follow lasix dose, lytes, creatinine 4. Follow cardiology outpatient 5. Folllow long term oxygen need - COPD - follow up pulm outpatient 6. Follow sleep study outpatient 7. Follow ischemic cardiac evaluation outpatient per cardiology 8. Follow up outpatient per cardiology  Discharge Diagnoses:  Active Problems:   Acute CHF (congestive heart failure) (HCC)   Discharge Condition: stable  Diet recommendation: stable  Filed Weights   06/15/19 1853 06/16/19 0355 06/17/19 0444  Weight: 124.1 kg 123.2 kg 120.7 kg    History of present illness:  [name] a54 y.o.Caucasian femalewith Belinda Young known history of COPD, type diabetes mellitus and morbid obesity, who presented to the emergency room with acute onset of worsening dyspnea over the last couple weeks with associated orthopnea and paroxysmal nocturnal dyspnea as well as worsening lower extremity edema. She admitted to dry cough as well as wheezing. She was admitted for new heart failure.  She was admitted for new onset heart failure.  She was also treated for Belinda Young COPD exacerbation.  Echo with normal EF.  Diuresed with IV lasix and transitioned to PO at discharge.  She required supplemental oxygen at discharge.  See below for additional details    Hospital Course:  1. Acute new onset CHF - Follow echo - normal ef (see report below) - continue 40 IV lasix BID -> transition to PO lasix at discharge - troponins minimally elevated, likely demand ischemia from HF exacerbation - cardiology c/s, appreciate recs - transition to PO lasix, follow outpatient sleep study, needs outpatient cardiac evaluation - echo with normal EF, normal  diastolic parameters - I/O, daily weights - net negative 4.8 L, weight down to 123.2 kg  - 3/12 CXR with early CHF -> repeat CXR on 3/14 with improved CHF  2. COPD probably with mild exacerbation. -treated for COPD exacerbation - required supplemental O2 at discharge - follow outpatient with cards  # RLE Cellulitis: improved, continue to monitor - continue abx   3. Tobacco abuse. -She stated that she quit couple of weeks ago. -encourage cessation  4. Type 2 diabetes mellitus. -The patient will be placed on supplemental coverage with NovoLog. -Hold oral home meds  5. Hypertension. -We will continue her lisinopril - on hold  Procedures: Echo IMPRESSIONS    1. Left ventricular ejection fraction, by estimation, is 60 to 65%. The  left ventricle has normal function. The left ventricle has no regional  wall motion abnormalities. Left ventricular diastolic parameters were  normal.  2. Right ventricular systolic function is normal. The right ventricular  size is mildly enlarged.  3. Right atrial size was mildly dilated.  4. The mitral valve is normal in structure. No evidence of mitral valve  regurgitation. No evidence of mitral stenosis.  5. The aortic valve is normal in structure. Aortic valve regurgitation is  not visualized. No aortic stenosis is present.   Consultations:  cardiology  Discharge Exam: Vitals:   06/17/19 1142 06/17/19 1144  BP: 131/62   Pulse: 83   Resp: 18   Temp: 98.2 F (36.8 C)   SpO2: 95% 96%   Feeling better Wants to go home today  General: No acute distress. Cardiovascular: Heart sounds show Belinda Young regular rate, and rhythm. No gallops or rubs. No murmurs. No  JVD. Lungs: occasional scattered wheeze Abdomen: Soft, nontender, nondistended with normal active bowel sounds. No masses. No hepatosplenomegaly. Neurological: Alert and oriented 3. Moves all extremities 4 with equal strength. Cranial nerves II through XII grossly  intact. Skin: Warm and dry. No rashes or lesions. Extremities: No clubbing or cyanosis. No edema.   Discharge Instructions   Discharge Instructions    AMB referral to CHF clinic   Complete by: As directed    AMB referral to pulmonary rehabilitation   Complete by: As directed    Please select Belinda Young program: Respiratory Care Services   Respiratory Care Services Diagnosis: Heart Failure   After initial evaluation and assessments completed: Virtual Based Care may be provided alone or in conjunction with Pulmonary Rehab/Respiratory Care services based on patient barriers.: Yes   Call MD for:  difficulty breathing, headache or visual disturbances   Complete by: As directed    Call MD for:  extreme fatigue   Complete by: As directed    Call MD for:  hives   Complete by: As directed    Call MD for:  persistant dizziness or light-headedness   Complete by: As directed    Call MD for:  persistant nausea and vomiting   Complete by: As directed    Call MD for:  redness, tenderness, or signs of infection (pain, swelling, redness, odor or green/yellow discharge around incision site)   Complete by: As directed    Call MD for:  severe uncontrolled pain   Complete by: As directed    Call MD for:  temperature >100.4   Complete by: As directed    Diet - low sodium heart healthy   Complete by: As directed    Discharge instructions   Complete by: As directed    You were seen for new onset heart failure and Belinda Young COPD exacerbation.  We've started you on lasix.  Please follow up with cardiology outpatient for your heart failure.  You need 3 L oxygen with activity and at rest.  Please follow up with your PCP regarding this and your long term oxygen needs.  Continue the steroids for your COPD exacerbation.  Return for new, recurrent, or worsening symptoms.  Please ask your PCP to request records from this hospitalization so they know what was done and what the next steps will be.   Increase activity  slowly   Complete by: As directed      Allergies as of 06/17/2019   No Known Allergies     Medication List    STOP taking these medications   cephALEXin 500 MG capsule Commonly known as: KEFLEX   sulfamethoxazole-trimethoprim 800-160 MG tablet Commonly known as: BACTRIM DS     TAKE these medications   albuterol 108 (90 Base) MCG/ACT inhaler Commonly known as: VENTOLIN HFA Inhale 2 puffs into the lungs every 6 (six) hours as needed. For SOB   albuterol (2.5 MG/3ML) 0.083% nebulizer solution Commonly known as: PROVENTIL Take 2.5 mg by nebulization every 6 (six) hours as needed. For SOB   amoxicillin-clavulanate 875-125 MG tablet Commonly known as: Augmentin Take 1 tablet by mouth 2 (two) times daily for 5 days.   Fluticasone-Salmeterol 250-50 MCG/DOSE Aepb Commonly known as: ADVAIR Inhale 1 puff into the lungs 2 (two) times daily.   furosemide 40 MG tablet Commonly known as: LASIX Take 1 tablet (40 mg total) by mouth daily. What changed:   medication strength  how much to take   gabapentin 300 MG capsule Commonly known as:  NEURONTIN Take 300 mg by mouth at bedtime.   glipiZIDE 5 MG tablet Commonly known as: GLUCOTROL Take 5 mg by mouth daily before breakfast.   ibuprofen 200 MG tablet Commonly known as: ADVIL Take 400 mg by mouth every 6 (six) hours as needed. For pain   insulin NPH Human 100 UNIT/ML injection Commonly known as: NOVOLIN N Inject 15 Units into the skin in the morning and at bedtime.   NOVOLOG FLEXPEN [place] Inject 20 Units into the skin with breakfast, with lunch, and with evening meal.   predniSONE 20 MG tablet Commonly known as: DELTASONE Take 2 tablets (40 mg total) by mouth daily with breakfast for 3 days.   quinapril 10 MG tablet Commonly known as: ACCUPRIL Take 20 mg by mouth at bedtime.   Qvar 80 MCG/ACT inhaler Generic drug: beclomethasone Inhale 1 puff into the lungs 2 (two) times daily.      No Known Allergies Follow-up  Information    [facility] Follow up on 06/25/2019.   Specialty: Cardiology Why: at 8:30am. Enter through the Medical Mall entrance Contact information: [address] [place] [address] [phone]       Sandrea Hughs, NP Follow up.   Specialty: Nurse Practitioner Contact information: [address] [place] [address] [phone]        Belinda Odea, MD. Go on 07/01/2019.   Specialties: Cardiology, Radiology Why: appointment at [facility] information: [address] [place] [address] [phone]        [facility]. Go on 06/21/2019.   Specialty: Pulmonology Why: Appointment at [facility] information: [address] [place] [address] [phone]           The results of significant diagnostics from this hospitalization (including imaging, microbiology, ancillary and laboratory) are listed below for reference.    Significant Diagnostic Studies: DG Chest 2 View  Result Date: 06/14/2019 CLINICAL DATA:  Shortness of breath for 2 weeks EXAM: CHEST - 2 VIEW COMPARISON:  11/27/2014 FINDINGS: Cardiac shadow is at the upper limits of normal in size. Mild vascular congestion is seen with mild interstitial edema. No sizable effusion is noted. No bony abnormality is noted. IMPRESSION: Early CHF. Electronically Signed   By: Alcide Clever M.D.   On: 06/14/2019 19:51   US Venous Img Lower Unilateral Right (DVT)  Result Date: 06/06/2019 CLINICAL DATA:  Right lower extremity pain and edema the past 2 weeks. History of smoking. Evaluate for DVT. EXAM: RIGHT LOWER EXTREMITY VENOUS DOPPLER ULTRASOUND TECHNIQUE: Gray-scale sonography with graded compression, as well as color Doppler and duplex ultrasound were performed to evaluate the lower extremity deep venous systems from the level of the common femoral vein and including the common femoral,  femoral, profunda femoral, popliteal and calf veins including the posterior tibial, peroneal and gastrocnemius veins when visible. The superficial great saphenous vein was also interrogated. Spectral Doppler was utilized to evaluate flow at rest and with distal augmentation maneuvers in the common femoral, femoral and popliteal veins. COMPARISON:  None. FINDINGS: Contralateral Common Femoral Vein: Respiratory phasicity is normal and symmetric with the symptomatic side. No evidence of thrombus. Normal compressibility. Common Femoral Vein: No evidence of thrombus. Normal compressibility, respiratory phasicity and response to augmentation. Saphenofemoral Junction: No evidence of thrombus. Normal compressibility and flow on color Doppler imaging. Profunda Femoral Vein: No evidence of thrombus. Normal compressibility and flow on color Doppler imaging. Femoral Vein: No  evidence of thrombus. Normal compressibility, respiratory phasicity and response to augmentation. Popliteal Vein: No evidence of thrombus. Normal compressibility, respiratory phasicity and response to augmentation. Calf Veins: No evidence of thrombus. Normal compressibility and flow on color Doppler imaging. Superficial Great Saphenous Vein: No evidence of thrombus. Normal compressibility. Venous Reflux:  None. Other Findings:  None. IMPRESSION: No evidence of DVT within the right lower extremity. Electronically Signed   By: Simonne Come M.D.   On: 06/06/2019 13:46   DG Chest Port 1 View  Result Date: 06/16/2019 CLINICAL DATA:  Heart failure per provider. Hx of COPD, DM, current smoker. Follow-up exam. EXAM: PORTABLE CHEST 1 VIEW COMPARISON:  06/14/2019 FINDINGS: Cardiac silhouette is normal in size. No mediastinal or hilar masses. There is vascular congestion without overt pulmonary edema. Mild interstitial thickening noted on the prior study has improved. No lung consolidation. No convincing pleural effusion and no pneumothorax. Skeletal structures are  grossly intact. IMPRESSION: 1. Mild CHF suggested on the prior study has improved/resolved. 2. No acute cardiopulmonary disease. Electronically Signed   By: Amie Portland M.D.   On: 06/16/2019 08:24   ECHOCARDIOGRAM COMPLETE  Result Date: 06/16/2019    ECHOCARDIOGRAM REPORT   Patient Name:   Belinda Young Date of Exam: 06/16/2019 Medical Rec #:  [id number]    Height:       67.0 in Accession #:    [id number]   Weight:       271.5 lb Date of Birth:  1965-07-01    BSA:          2.303 m Patient Age:    53 years     BP:           105/32 mmHg Patient Gender: F            HR:           84 bpm. Exam Location:  [facility] Procedure: 2D Echo Indications:     Diastolic CHF  History:         Patient has no prior history of Echocardiogram examinations.                  COPD; Risk Factors:Morbid Obesity and Diabetes.  Sonographer:     LTM Referring Phys:  [id number] Belinda Young Diagnosing Phys: Belinda Odea MD IMPRESSIONS  1. Left ventricular ejection fraction, by estimation, is 60 to 65%. The left ventricle has normal function. The left ventricle has no regional wall motion abnormalities. Left ventricular diastolic parameters were normal.  2. Right ventricular systolic function is normal. The right ventricular size is mildly enlarged.  3. Right atrial size was mildly dilated.  4. The mitral valve is normal in structure. No evidence of mitral valve regurgitation. No evidence of mitral stenosis.  5. The aortic valve is normal in structure. Aortic valve regurgitation is not visualized. No aortic stenosis is present. FINDINGS  Left Ventricle: Left ventricular ejection fraction, by estimation, is 60 to 65%. The left ventricle has normal function. The left ventricle has no regional wall motion abnormalities. The left ventricular internal cavity size was normal in size. There is  no left ventricular hypertrophy. Left ventricular diastolic parameters were normal. Right Ventricle: The right ventricular size is mildly enlarged. No increase  in right ventricular wall thickness. Right ventricular systolic function is normal. Left Atrium: Left atrial size was normal in size. Right Atrium: Right atrial size was mildly dilated. Pericardium: There is no evidence of pericardial effusion. Mitral Valve: The mitral valve is normal  in structure. Normal mobility of the mitral valve leaflets. No evidence of mitral valve regurgitation. No evidence of mitral valve stenosis. Tricuspid Valve: The tricuspid valve is grossly normal. Tricuspid valve regurgitation is not demonstrated. No evidence of tricuspid stenosis. Aortic Valve: The aortic valve is normal in structure. Aortic valve regurgitation is not visualized. No aortic stenosis is present. Aortic valve mean gradient measures 5.0 mmHg. Aortic valve peak gradient measures 7.5 mmHg. Aortic valve area, by VTI measures 2.44 cm. Pulmonic Valve: The pulmonic valve was normal in structure. Pulmonic valve regurgitation is not visualized. No evidence of pulmonic stenosis. Aorta: The aortic root is normal in size and structure. Venous: The inferior vena cava was not well visualized. IAS/Shunts: No atrial level shunt detected by color flow Doppler.  LEFT VENTRICLE PLAX 2D LVIDd:         4.90 cm  Diastology LVIDs:         3.10 cm  LV e' lateral:   9.46 cm/s LV PW:         1.13 cm  LV E/e' lateral: 10.6 LV IVS:        0.97 cm  LV e' medial:    6.74 cm/s LVOT diam:     1.90 cm  LV E/e' medial:  14.8 LV SV:         71 LV SV Index:   31 LVOT Area:     2.84 cm  RIGHT VENTRICLE RV S prime:     15.70 cm/s TAPSE (M-mode): 2.1 cm LEFT ATRIUM             Index       RIGHT ATRIUM           Index LA diam:        3.00 cm 1.30 cm/m  RA Area:     22.10 cm LA Vol (A2C):   34.8 ml 15.11 ml/m RA Volume:   73.60 ml  31.96 ml/m LA Vol (A4C):   60.9 ml 26.45 ml/m LA Biplane Vol: 45.9 ml 19.93 ml/m  AORTIC VALVE                    PULMONIC VALVE AV Area (Vmax):    2.40 cm     PV Vmax:       1.52 m/s AV Area (Vmean):   2.28 cm     PV  Vmean:      101.000 cm/s AV Area (VTI):     2.44 cm     PV VTI:        0.292 m AV Vmax:           137.00 cm/s  PV Peak grad:  9.2 mmHg AV Vmean:          103.000 cm/s PV Mean grad:  5.0 mmHg AV VTI:            0.290 m AV Peak Grad:      7.5 mmHg AV Mean Grad:      5.0 mmHg LVOT Vmax:         116.00 cm/s LVOT Vmean:        82.900 cm/s LVOT VTI:          0.250 m LVOT/AV VTI ratio: 0.86  AORTA Ao Root diam: 3.10 cm MITRAL VALVE               TRICUSPID VALVE MV Area (PHT): 4.49 cm    TR Peak grad:   10.8 mmHg MV E velocity: 99.90  cm/s  TR Vmax:        164.00 cm/s MV Belinda Young velocity: 86.30 cm/s MV E/Belinda Young ratio:  1.16        SHUNTS                            Systemic VTI:  0.25 m                            Systemic Diam: 1.90 cm Belinda Odea MD Electronically signed by Belinda Odea MD Signature Date/Time: 06/16/2019/1:03:30 PM    Final     Microbiology: Recent Results (from the past 240 hour(s))  SARS CORONAVIRUS 2 (TAT 6-24 HRS) Nasopharyngeal Nasopharyngeal Swab     Status: None   Collection Time: 06/14/19 11:08 PM   Specimen: Nasopharyngeal Swab  Result Value Ref Range Status   SARS Coronavirus 2 NEGATIVE NEGATIVE Final    Comment: (NOTE) SARS-CoV-2 target nucleic acids are NOT DETECTED. The SARS-CoV-2 RNA is generally detectable in upper and lower respiratory specimens during the acute phase of infection. Negative results do not preclude SARS-CoV-2 infection, do not rule out co-infections with other pathogens, and should not be used as the sole basis for treatment or other patient management decisions. Negative results must be combined with clinical observations, patient history, and epidemiological information. The expected result is Negative. Fact Sheet for Patients: HairSlick.no Fact Sheet for Healthcare Providers: quierodirigir.com This test is not yet approved or cleared by the [place] FDA and  has been authorized for detection  and/or diagnosis of SARS-CoV-2 by FDA under an Emergency Use Authorization (EUA). This EUA will remain  in effect (meaning this test can be used) for the duration of the COVID-19 declaration under Section 56 4(b)(1) of the Act, 21 U.S.C. section 360bbb-3(b)(1), unless the authorization is terminated or revoked sooner. Performed at [facility], [address] [place], [place] [address]      Labs: Basic Metabolic Panel: Recent Labs  Lab 06/14/19 1923 06/15/19 0410 06/16/19 0459 06/17/19 0520  NA 135 136 138 139  K 4.5 4.2 3.9 3.7  CL 92* 90* 88* 87*  CO2 33* 35* 39* 39*  GLUCOSE 135* 192* 129* 152*  BUN 27*  CREATININE 0.95 1.02* 0.89 0.95  CALCIUM 9.3 9.1 8.9 9.1  MG  --   --  2.0 2.1  PHOS  --   --  4.6 4.6   Liver Function Tests: Recent Labs  Lab 06/16/19 0459 06/17/19 0520  AST 14* 16  ALT 17 16  ALKPHOS 81 76  BILITOT 0.7 0.7  PROT 6.5 6.7  ALBUMIN 3.2* 3.5   No results for input(s): LIPASE, AMYLASE in the last 168 hours. No results for input(s): AMMONIA in the last 168 hours. CBC: Recent Labs  Lab 06/14/19 1923 06/16/19 0459 06/17/19 0520  WBC 11.1* 10.1 9.8  NEUTROABS 8.1* 6.8 6.3  HGB 13.7 13.8 14.1  HCT 47.0* 46.2* 47.9*  MCV 89.5 88.3 87.6  PLT 300 315 343   Cardiac Enzymes: No results for input(s): CKTOTAL, CKMB, CKMBINDEX, TROPONINI in the last 168 hours. BNP: BNP (last 3 results) Recent Labs    06/14/19 1923 06/16/19 0459  BNP 94.0 51.0    ProBNP (last 3 results) No results for input(s): PROBNP in the last 8760 hours.  CBG: Recent Labs  Lab 06/16/19 1125 06/16/19 1640 06/16/19 2050 06/17/19 0746 06/17/19 1143  GLUCAP  223* 304* 341* 155* 249*       Signed:  Lacretia Nicks MD.  [facility] 06/19/2019, 2:03 AM

## 2019-06-17 NOTE — Telephone Encounter (Signed)
TOC- pt currently admitted. Call 06/18/19.

## 2019-06-17 NOTE — Progress Notes (Signed)
Patient discharged to home.  Tele and IV d/c'd.  Verbalizes understanding of discharge instructions.  Patient received home oxygen prior to going home.

## 2019-06-17 NOTE — Telephone Encounter (Signed)
-----   Message from Iran Ouch, MD sent at 06/17/2019  9:12 AM EDT ----- Possible discharge home today. TCM follow-up needed with Dr. Myriam Forehand or APP in 1 to 2 weeks.  Recommend follow-up basic metabolic profile.  Consider outpatient sleep study and ischemic cardiac evaluation.

## 2019-06-17 NOTE — Progress Notes (Signed)
SATURATION QUALIFICATIONS: (This note is used to comply with regulatory documentation for home oxygen)  Patient Saturations on Room Air at Rest = 82%  Patient Saturations on Room Air while Ambulating = 79%  Patient Saturations on 3 Liters of oxygen while Ambulating = 94%  Please briefly explain why patient needs home oxygen:

## 2019-06-17 NOTE — Telephone Encounter (Signed)
TOC-Awaiting scheduling. 

## 2019-06-17 NOTE — Progress Notes (Signed)
Progress Note  Patient Name: Belinda Young Date of Encounter: 06/17/2019  Primary Cardiologist: Kate Sable, MD   Subjective   She feels better overall and close to baseline.  She is negative almost 8 L for the admission and weight is down almost 4 kg.  No chest pain.  Inpatient Medications    Scheduled Meds: . budesonide  2 mL Inhalation BID  . enoxaparin (LOVENOX) injection  40 mg Subcutaneous Q24H  . furosemide  40 mg Oral Daily  . gabapentin  300 mg Oral QHS  . insulin aspart  0-20 Units Subcutaneous TID WC  . insulin aspart  0-5 Units Subcutaneous QHS  . insulin aspart  4 Units Subcutaneous TID WC  . insulin detemir  12 Units Subcutaneous BID AC & HS  . ipratropium-albuterol  3 mL Nebulization QID  . predniSONE  40 mg Oral Q breakfast  . sodium chloride flush  3 mL Intravenous Q12H   Continuous Infusions: . sodium chloride     PRN Meds: sodium chloride, acetaminophen, ALPRAZolam, diphenhydrAMINE, ondansetron (ZOFRAN) IV, sodium chloride flush   Vital Signs    Vitals:   06/16/19 1944 06/16/19 1958 06/17/19 0444 06/17/19 0747  BP: 129/64  126/60 (!) 156/55  Pulse: 93  90 97  Resp:   20 18  Temp:   98.7 F (37.1 C) 97.6 F (36.4 C)  TempSrc:   Oral Oral  SpO2:  92% 100% 94%  Weight:   120.7 kg   Height:        Intake/Output Summary (Last 24 hours) at 06/17/2019 0908 Last data filed at 06/17/2019 0740 Gross per 24 hour  Intake 600 ml  Output 4150 ml  Net -3550 ml   Last 3 Weights 06/17/2019 06/16/2019 06/15/2019  Weight (lbs) 266 lb 1.6 oz 271 lb 8 oz 273 lb 8 oz  Weight (kg) 120.702 kg 123.152 kg 124.059 kg      Telemetry    Sinus rhythm, heart rate 86- Personally Reviewed  ECG    No new tracing obtained- Personally Reviewed  Physical Exam   GEN: No acute distress.   Neck: No JVD Cardiac: RRR, no murmurs, rubs, or gallops.  Respiratory:  Mild inspiratory and expiratory wheezing bilaterally GI: Soft, nontender, non-distended  MS:  Mild  bilateral leg edema Neuro:  Nonfocal  Psych: Normal affect   Labs    High Sensitivity Troponin:   Recent Labs  Lab 06/14/19 1923 06/15/19 0410  TROPONINIHS 27* 25*      Chemistry Recent Labs  Lab 06/15/19 0410 06/16/19 0459 06/17/19 0520  NA 136 138 139  K 4.2 3.9 3.7  CL 90* 88* 87*  CO2 35* 39* 39*  GLUCOSE 192* 129* 152*  BUN 14 20 27*  CREATININE 1.02* 0.89 0.95  CALCIUM 9.1 8.9 9.1  PROT  --  6.5 6.7  ALBUMIN  --  3.2* 3.5  AST  --  14* 16  ALT  --  17 16  ALKPHOS  --  81 76  BILITOT  --  0.7 0.7  GFRNONAA >60 >60 >60  GFRAA >60 >60 >60  ANIONGAP 11 11 13      Hematology Recent Labs  Lab 06/14/19 1923 06/16/19 0459 06/17/19 0520  WBC 11.1* 10.1 9.8  RBC 5.25* 5.23* 5.47*  HGB 13.7 13.8 14.1  HCT 47.0* 46.2* 47.9*  MCV 89.5 88.3 87.6  MCH 26.1 26.4 25.8*  MCHC 29.1* 29.9* 29.4*  RDW 16.5* 16.2* 15.8*  PLT 300 315 343  BNP Recent Labs  Lab 06/14/19 1923 06/16/19 0459  BNP 94.0 51.0     DDimer No results for input(s): DDIMER in the last 168 hours.   Radiology    DG Chest Port 1 View  Result Date: 06/16/2019 CLINICAL DATA:  Heart failure per provider. Hx of COPD, DM, current smoker. Follow-up exam. EXAM: PORTABLE CHEST 1 VIEW COMPARISON:  06/14/2019 FINDINGS: Cardiac silhouette is normal in size. No mediastinal or hilar masses. There is vascular congestion without overt pulmonary edema. Mild interstitial thickening noted on the prior study has improved. No lung consolidation. No convincing pleural effusion and no pneumothorax. Skeletal structures are grossly intact. IMPRESSION: 1. Mild CHF suggested on the prior study has improved/resolved. 2. No acute cardiopulmonary disease. Electronically Signed   By: Amie Portland M.D.   On: 06/16/2019 08:24   ECHOCARDIOGRAM COMPLETE  Result Date: 06/16/2019    ECHOCARDIOGRAM REPORT   Patient Name:   Belinda Young Date of Exam: 06/16/2019 Medical Rec #:  [id number]    Height:       67.0 in Accession #:     [id number]   Weight:       271.5 lb Date of Birth:  [birth year]    BSA:          2.303 m Patient Age:    54 years     BP:           105/32 mmHg Patient Gender: F            HR:           84 bpm. Exam Location:  [facility] Procedure: 2D Echo Indications:     Diastolic CHF  History:         Patient has no prior history of Echocardiogram examinations.                  COPD; Risk Factors:Morbid Obesity and Diabetes.  Sonographer:     LTM Referring Phys:  [id number] Vernetta Honey MANSY Diagnosing Phys: Debbe Odea MD IMPRESSIONS  1. Left ventricular ejection fraction, by estimation, is 60 to 65%. The left ventricle has normal function. The left ventricle has no regional wall motion abnormalities. Left ventricular diastolic parameters were normal.  2. Right ventricular systolic function is normal. The right ventricular size is mildly enlarged.  3. Right atrial size was mildly dilated.  4. The mitral valve is normal in structure. No evidence of mitral valve regurgitation. No evidence of mitral stenosis.  5. The aortic valve is normal in structure. Aortic valve regurgitation is not visualized. No aortic stenosis is present. FINDINGS  Left Ventricle: Left ventricular ejection fraction, by estimation, is 60 to 65%. The left ventricle has normal function. The left ventricle has no regional wall motion abnormalities. The left ventricular internal cavity size was normal in size. There is  no left ventricular hypertrophy. Left ventricular diastolic parameters were normal. Right Ventricle: The right ventricular size is mildly enlarged. No increase in right ventricular wall thickness. Right ventricular systolic function is normal. Left Atrium: Left atrial size was normal in size. Right Atrium: Right atrial size was mildly dilated. Pericardium: There is no evidence of pericardial effusion. Mitral Valve: The mitral valve is normal in structure. Normal mobility of the mitral valve leaflets. No evidence of mitral valve regurgitation. No evidence  of mitral valve stenosis. Tricuspid Valve: The tricuspid valve is grossly normal. Tricuspid valve regurgitation is not demonstrated. No evidence of tricuspid stenosis. Aortic Valve: The aortic valve is normal in structure.  Aortic valve regurgitation is not visualized. No aortic stenosis is present. Aortic valve mean gradient measures 5.0 mmHg. Aortic valve peak gradient measures 7.5 mmHg. Aortic valve area, by VTI measures 2.44 cm. Pulmonic Valve: The pulmonic valve was normal in structure. Pulmonic valve regurgitation is not visualized. No evidence of pulmonic stenosis. Aorta: The aortic root is normal in size and structure. Venous: The inferior vena cava was not well visualized. IAS/Shunts: No atrial level shunt detected by color flow Doppler.  LEFT VENTRICLE PLAX 2D LVIDd:         4.90 cm  Diastology LVIDs:         3.10 cm  LV e' lateral:   9.46 cm/s LV PW:         1.13 cm  LV E/e' lateral: 10.6 LV IVS:        0.97 cm  LV e' medial:    6.74 cm/s LVOT diam:     1.90 cm  LV E/e' medial:  14.8 LV SV:         71 LV SV Index:   31 LVOT Area:     2.84 cm  RIGHT VENTRICLE RV S prime:     15.70 cm/s TAPSE (M-mode): 2.1 cm LEFT ATRIUM             Index       RIGHT ATRIUM           Index LA diam:        3.00 cm 1.30 cm/m  RA Area:     22.10 cm LA Vol (A2C):   34.8 ml 15.11 ml/m RA Volume:   73.60 ml  31.96 ml/m LA Vol (A4C):   60.9 ml 26.45 ml/m LA Biplane Vol: 45.9 ml 19.93 ml/m  AORTIC VALVE                    PULMONIC VALVE AV Area (Vmax):    2.40 cm     PV Vmax:       1.52 m/s AV Area (Vmean):   2.28 cm     PV Vmean:      101.000 cm/s AV Area (VTI):     2.44 cm     PV VTI:        0.292 m AV Vmax:           137.00 cm/s  PV Peak grad:  9.2 mmHg AV Vmean:          103.000 cm/s PV Mean grad:  5.0 mmHg AV VTI:            0.290 m AV Peak Grad:      7.5 mmHg AV Mean Grad:      5.0 mmHg LVOT Vmax:         116.00 cm/s LVOT Vmean:        82.900 cm/s LVOT VTI:          0.250 m LVOT/AV VTI ratio: 0.86  AORTA Ao Root  diam: 3.10 cm MITRAL VALVE               TRICUSPID VALVE MV Area (PHT): 4.49 cm    TR Peak grad:   10.8 mmHg MV E velocity: 99.90 cm/s  TR Vmax:        164.00 cm/s MV A velocity: 86.30 cm/s MV E/A ratio:  1.16        SHUNTS  Systemic VTI:  0.25 m                            Systemic Diam: 1.90 cm Debbe Odea MD Electronically signed by Debbe Odea MD Signature Date/Time: 06/16/2019/1:03:30 PM    Final     Cardiac Studies   Echocardiogram done yesterday:   1. Left ventricular ejection fraction, by estimation, is 60 to 65%. The  left ventricle has normal function. The left ventricle has no regional  wall motion abnormalities. Left ventricular diastolic parameters were  normal.  2. Right ventricular systolic function is normal. The right ventricular  size is mildly enlarged.  3. Right atrial size was mildly dilated.  4. The mitral valve is normal in structure. No evidence of mitral valve  regurgitation. No evidence of mitral stenosis.  5. The aortic valve is normal in structure. Aortic valve regurgitation is  not visualized. No aortic stenosis is present.   Patient Profile     54 y.o. female with history of diabetes, COPD who presented with worsening shortness of breath and leg edema thought to be due to acute on chronic diastolic heart failure   Assessment & Plan    1.   Acute on chronic diastolic heart failure: She responded very well to diuresis and weight went down almost 4 kg.  She feels close to her baseline.  Echocardiogram showed normal LV systolic function. Suspect that underlying COPD might also be contributing some of her dyspnea. BUN is starting to increase and I suspect that she is currently euvolemic. I switched furosemide to 40 mg by mouth once daily. I discussed with her the importance of low-sodium diet. Consider screening for sleep apnea as an outpatient Recommend ischemic cardiac evaluation given multiple risk factors for  coronary artery disease.  This can be done as an outpatient.  2.  Right lower extremity edema -Recent ultrasound with no DVT noted -Antibiotics/treatment of cellulitis as per primary team  3.  History of COPD -Patient advised to stop smoking upon discharge -Management and breathing treatment as per primary team  The patient wants to go home if possible.  I think that is reasonable.  We can arrange for follow-up in our office in 1 to 2 weeks.      Signed, Lorine Bears, MD  06/17/2019, 9:08 AM

## 2019-06-18 NOTE — Telephone Encounter (Signed)
TCM- 1st attempt Attempted to call the patient. No answer- message received stating the call could not be completed at this time. Will try the patient back tomorrow.

## 2019-06-19 ENCOUNTER — Other Ambulatory Visit: Payer: Self-pay | Admitting: *Deleted

## 2019-06-19 DIAGNOSIS — I509 Heart failure, unspecified: Secondary | ICD-10-CM

## 2019-06-19 NOTE — Telephone Encounter (Signed)
Patient contacted regarding discharge from [facility] on 06/17/19.  Patient understands to follow up with provider Eula Listen, PA on 07/01/19 at 10am at [facility]. Patient understands discharge instructions? yes Patient understands medications and regiment? yes Patient understands to bring all medications to this visit? yes  Ask patient:  Are you enrolled in My Chart (NO)  If no ask patient if they would like to enroll. Patient currently has the code and is in process.

## 2019-06-21 ENCOUNTER — Telehealth: Payer: Self-pay | Admitting: Family

## 2019-06-21 ENCOUNTER — Ambulatory Visit: Payer: Medicaid Other | Admitting: Internal Medicine

## 2019-06-21 ENCOUNTER — Encounter: Payer: Self-pay | Admitting: Internal Medicine

## 2019-06-21 ENCOUNTER — Other Ambulatory Visit: Payer: Self-pay

## 2019-06-21 DIAGNOSIS — I10 Essential (primary) hypertension: Secondary | ICD-10-CM | POA: Diagnosis not present

## 2019-06-21 DIAGNOSIS — J9612 Chronic respiratory failure with hypercapnia: Secondary | ICD-10-CM

## 2019-06-21 DIAGNOSIS — J449 Chronic obstructive pulmonary disease, unspecified: Secondary | ICD-10-CM | POA: Diagnosis not present

## 2019-06-21 DIAGNOSIS — J9611 Chronic respiratory failure with hypoxia: Secondary | ICD-10-CM | POA: Diagnosis not present

## 2019-06-21 MED ORDER — BREZTRI AEROSPHERE 160-9-4.8 MCG/ACT IN AERO: 2.0000 | INHALATION_SPRAY | Freq: Two times a day (BID) | RESPIRATORY_TRACT | 11 refills | Status: DC

## 2019-06-21 MED ORDER — BREZTRI AEROSPHERE 160-9-4.8 MCG/ACT IN AERO: 2.0000 | INHALATION_SPRAY | Freq: Two times a day (BID) | RESPIRATORY_TRACT | 0 refills | Status: DC

## 2019-06-21 NOTE — Progress Notes (Signed)
Belinda Young, female    DOB: 02-25-1966, 54 y.o.   MRN: [id number]   Brief patient profile:  53 yowf quit smoking 06/14/19 at admit on advair/spiriva dpi / ACEi   Admit date: 06/14/2019 Discharge date: 06/19/2019   Recommendations for Outpatient Follow-up:  1. Follow outpatient CBC/CMP 2. Follow volume status outpatient 3. Follow lasix dose, lytes, creatinine 4. Follow cardiology outpatient 5. Folllow long term oxygen need - COPD - follow up pulm outpatient 6. Follow sleep study outpatient 7. Follow ischemic cardiac evaluation outpatient per cardiology 8. Follow up outpatient per cardiology  Discharge Diagnoses:  Active Problems:   Acute CHF (congestive heart failure) (HCC)           Filed Weights   06/15/19 1853 06/16/19 0355 06/17/19 0444  Weight: 124.1 kg 123.2 kg 120.7 kg    History of present illness:  Belinda Young a53 y.o.Caucasian femalewith a known history of COPD, type diabetes mellitus and morbid obesity, who presented to the emergency room with acute onset of worsening dyspnea over the last couple weeks with associated orthopnea and paroxysmal nocturnal dyspnea as well as worsening lower extremity edema. She admitted to dry cough as well as wheezing. She was admitted for new heart failure.  She was admitted for new onset heart failure.  She was also treated for a COPD exacerbation.  Echo with normal EF.  Diuresed with IV lasix and transitioned to PO at discharge.  She required supplemental oxygen at discharge.  See below for additional details    Hospital Course:  1. Acute new onset CHF - Follow echo - normal ef (see report below) - continue 40 IV lasix BID -> transition to PO lasix at discharge - troponins minimally elevated, likely demand ischemia from HF exacerbation -cardiology c/s, appreciate recs - transition to PO lasix, follow outpatient sleep study, needs outpatient cardiac evaluation - echo with normal EF, normal diastolic  parameters - I/O, daily weights- net negative 4.8 L, weight down to 123.2 kg - 3/12 CXR with early CHF -> repeat CXR on 3/14with improved CHF  2. COPD probably with mild exacerbation. -treated for COPD exacerbation - required supplemental O2 at discharge - follow outpatient with cards  # RLE Cellulitis: improved, continue to monitor - continue abx  3. Tobacco abuse. -She stated that she quit couple of weeks ago. -encourage cessation  4. Type 2 diabetes mellitus. -The patient will be placed on supplemental coverage with NovoLog. -Hold oral home meds  5. Hypertension. -We will continue her lisinopril- on hold  Procedures: Echo IMPRESSIONS    1. Left ventricular ejection fraction, by estimation, is 60 to 65%. The  left ventricle has normal function. The left ventricle has no regional  wall motion abnormalities. Left ventricular diastolic parameters were  normal.  2. Right ventricular systolic function is normal. The right ventricular  size is mildly enlarged.  3. Right atrial size was mildly dilated.  4. The mitral valve is normal in structure. No evidence of mitral valve  regurgitation. No evidence of mitral stenosis.  5. The aortic valve is normal in structure. Aortic valve regurgitation is  not visualized. No aortic stenosis is present.       History of Present Illness  06/21/2019  Pulmonary/ 1st office eval/Renae Mottley  Chief Complaint  Patient presents with  . Pulmonary Consult    recent admission to [facility] for COPD. She was sent home on o2 3lpm 24/7. She states that her breathing has improved since hospital d/c. She is using her albuterol  inhaler 7 x per wk and neb with albuterol 3 x per day.   Dyspnea:  Around the house on 3lpm sats ok  Cough: none Sleep: on Left side/ bed is flat 2 pillws SABA use: as above -way too much, poor insight into maint vs prn  02 3lpm 24/7   No obvious day to day or daytime variability or assoc excess/ purulent sputum or  mucus plugs or hemoptysis or cp or chest tightness, subjective wheeze or overt sinus or hb symptoms.   Sleeping  now  without nocturnal  or early am exacerbation  of respiratory  c/o's or need for noct saba. Also denies any obvious fluctuation of symptoms with weather or environmental changes or other aggravating or alleviating factors except as outlined above   No unusual exposure hx or h/o childhood pna/ asthma or knowledge of premature birth.  Current Allergies, Complete Past Medical History, Past Surgical History, Family History, and Social History were reviewed in [employer] record.  ROS  The following are not active complaints unless bolded Hoarseness, sore throat, dysphagia, dental problems, itching, sneezing,  nasal congestion or discharge of excess mucus or purulent secretions, ear ache,   fever, chills, sweats, unintended wt loss or wt gain, classically pleuritic or exertional cp,  orthopnea pnd or arm/hand swelling  or leg swelling, presyncope, palpitations, abdominal pain, anorexia, nausea, vomiting, diarrhea  or change in bowel habits or change in bladder habits, change in stools or change in urine, dysuria, hematuria,  rash, arthralgias, visual complaints, headache, numbness, weakness or ataxia or problems with walking or coordination,  change in mood or  memory.         Past Medical History:  Diagnosis Date  . Cellulitis of trunk   . COPD (chronic obstructive pulmonary disease) (HCC)   . Diabetes mellitus   . Pedal edema     Outpatient Medications Prior to Visit  Medication Sig Dispense Refill  . albuterol (PROVENTIL HFA;VENTOLIN HFA) 108 (90 BASE) MCG/ACT inhaler Inhale 2 puffs into the lungs every 6 (six) hours as needed. For SOB    . albuterol (PROVENTIL) (2.5 MG/3ML) 0.083% nebulizer solution Take 2.5 mg by nebulization every 6 (six) hours as needed. For SOB    . amoxicillin-clavulanate (AUGMENTIN) 875-125 MG tablet Take 1 tablet by mouth 2 (two)  times daily for 5 days. 10 tablet 0  . beclomethasone (QVAR) 80 MCG/ACT inhaler Inhale 1 puff into the lungs 2 (two) times daily.    . Fluticasone-Salmeterol (ADVAIR) 250-50 MCG/DOSE AEPB Inhale 1 puff into the lungs 2 (two) times daily.    . furosemide (LASIX) 40 MG tablet Take 1 tablet (40 mg total) by mouth daily. 30 tablet 0  . gabapentin (NEURONTIN) 300 MG capsule Take 300 mg by mouth at bedtime.    Marland Kitchen ibuprofen (ADVIL,MOTRIN) 200 MG tablet Take 400 mg by mouth every 6 (six) hours as needed. For pain    . Insulin Aspart (NOVOLOG FLEXPEN ) Inject 20 Units into the skin with breakfast, with lunch, and with evening meal.     . insulin NPH Human (NOVOLIN N) 100 UNIT/ML injection Inject 15 Units into the skin in the morning and at bedtime.    . quinapril (ACCUPRIL) 10 MG tablet Take 20 mg by mouth at bedtime.     Marland Kitchen glipiZIDE (GLUCOTROL) 5 MG tablet Take 5 mg by mouth daily before breakfast.    . predniSONE (DELTASONE) 20 MG tablet Take 2 tablets (40 mg total) by mouth daily with  breakfast for 3 days. 6 tablet 0   No facility-administered medications prior to visit.     Objective:     BP 126/78 (BP Location: Left Arm, Cuff Size: Large)   Pulse 72   Temp (!) 97.3 F (36.3 C) (Temporal)   Ht 5\' 7"  (1.702 m)   Wt 269 lb (122 kg)   LMP 11/02/2014 Comment: tubal ligation  SpO2 98% Comment: on 3lpm cont o2  BMI 42.13 kg/m   SpO2: 98 %(on 3lpm cont o2) O2 Type: Continuous O2 O2 Flow Rate (L/min): 3 L/min    Obese wf nad    HEENT : pt wearing mask not removed for exam due to covid - 19 concerns.    NECK :  without JVD/Nodes/TM/ nl carotid upstrokes bilaterally   LUNGS: no acc muscle use,  Mild barrel  contour chest wall with bilateral  Distant   Wheeze bilaterally  and  without cough on insp or exp maneuvers  and mild  Hyperresonant  to  percussion bilaterally     CV:  RRR  no s3 or murmur or increase in P2, and trace pitting on R/ none on L LE  ABD: massively obese but   soft and nontender with pos end  insp Hoover's  in the supine position. No bruits or organomegaly appreciated, bowel sounds nl  MS:   Nl gait/  ext warm without deformities, calf tenderness, cyanosis or clubbing No obvious joint restrictions   SKIN: warm and dry without lesions    NEURO:  alert, approp, nl sensorium with  no motor or cerebellar deficits apparent.       I personally reviewed images and agree with radiology impression as follows:  CXR: portable 06/16/19 1. Mild CHF suggested on the prior study has improved/resolved. 2. No acute cardiopulmonary disease.   Labs  reviewed:      Chemistry      Component Value Date/Time   NA 139 06/17/2019 0520   K 3.7 06/17/2019 0520   CL 87 (L) 06/17/2019 0520   CO2 39 (H) 06/17/2019 0520   BUN 27 (H) 06/17/2019 0520   CREATININE 0.95 06/17/2019 0520      Component Value Date/Time   CALCIUM 9.1 06/17/2019 0520   ALKPHOS 76 06/17/2019 0520   AST 16 06/17/2019 0520   ALT 16 06/17/2019 0520   BILITOT 0.7 06/17/2019 0520        Lab Results  Component Value Date   WBC 9.8 06/17/2019   HGB 14.1 06/17/2019   HCT 47.9 (H) 06/17/2019   MCV 87.6 06/17/2019   PLT 343 06/17/2019            Assessment   COPD GOLD D  Quit smoking 06/14/19 - 06/21/2019  After extensive coaching inhaler device,  effectiveness =    90% > try breztri   Group D in terms of symptom/risk and laba/lama/ICS  therefore appropriate rx at this point >>>  Breztri 2 bid   Advised:  formulary restrictions will be an ongoing challenge for the forseable future and I would be happy to pick an alternative if the pt will first  provide me a list of them -  pt  will need to return here for training for any new device that is required eg dpi vs hfa vs respimat.    In the meantime we can always provide samples so that the patient never runs out of any needed respiratory medications.   Also: Pt informed of the seriousness of COVID 19  infection as a direct risk to  lung health  and safey and to close contacts and should continue to wear a facemask in public and minimize exposure to public locations but especially avoid any area or activity where non-close contacts are not observing distancing or wearing an appropriate face mask.  I strongly recommended vaccine when offered.        Chronic respiratory failure with hypoxia and hypercapnia ([place]) HC03   06/17/19 = 43  Newly placed on chronic 02 at d/c 06/19/19   As of 06/21/2019 rec 3lpm hs as long as no am ha/ ams and titrate daytime to low 90s    Morbid obesity due to excess calories ([place]) Complicated by probable OHS as of 06/2019 admit   Body mass index is 42.13 kg/m.   No results found for: TSH   Contributing to gerd risk/ doe probably as much or more than her copd /reviewed the need and the process to achieve and maintain neg calorie balance > defer f/u primary care including intermittently monitoring thyroid status        Essential hypertension On ACEi as of 06/21/2019   ACE inhibitors are problematic in  pts with airway complaints because  even experienced pulmonologists can't always distinguish ace effects from copd/asthma.  By themselves they don't actually cause a problem, much like oxygen can't by itself start a fire, but they certainly serve as a powerful catalyst or enhancer for any "fire"  or inflammatory process in the upper airway, be it caused by an ET  tube or more commonly reflux (especially in the obese or pts with known GERD or who are on biphoshonates).   For any unusual coughing or unexplained sob would have a low threshold to try off acei and on ARB x 6 week trial but since she is well controlled and I am stopping dpi rx today (since dpi additive with ACEi in terms of Upper airway irritation) she should be fine on accupril for now.     Each maintenance medication was reviewed in detail including emphasizing most importantly the difference between maintenance and prns and under  what circumstances the prns are to be triggered using an action plan format where appropriate.  Total time for H and P, chart review, counseling, teaching device and generating customized AVS unique to this post hosp/ transition of care initial  office visit / charting = 60 min       Christinia Gully, MD 06/21/2019

## 2019-06-21 NOTE — Patient Instructions (Addendum)
Plan A = Automatic = Always=    Breztri (symbicor 160 )Take 2 puffs first thing in am and then another 2 puffs about 12 hours later.   Work on inhaler technique:  relax and gently blow all the way out then take a nice smooth deep breath back in, triggering the inhaler at same time you start breathing in.  Hold for up to 5 seconds if you can. Blow out thru nose. Rinse and gargle with water when done - arm and hammer toothpaste slurry after you use breztri      Stop your advair and spirva  - call me with any issues with getting Breztri thru insurance   Plan B = Backup (to supplement plan A, not to replace it) Only use your albuterol inhaler as a rescue medication to be used if you can't catch your breath by resting or doing a relaxed purse lip breathing pattern.  - The less you use it, the better it will work when you need it. - Ok to use the inhaler up to 2 puffs  every 4 hours if you must but call for appointment if use goes up over your usual need - Don't leave home without it !!  (think of it like the spare tire for your car)   Plan C = Crisis (instead of Plan B but only if Plan B stops working) - only use your albuterol nebulizer if you first try Plan B and it fails to help > ok to use the nebulizer up to every 4 hours but if start needing it regularly call for immediate appointment   Plan D = Doctor - call me if B and C not adequate  Plan E = ER - go to ER or call 911 if all else fails     02 3lpm at bedtime and we'll do ONO on 3lpm   02 adjust 02 to saturations 90-94% no higher during the day    Please schedule a follow up office visit in 4 weeks, sooner if needed  with all medications /inhalers/ solutions in hand so we can verify exactly what you are taking. This includes all medications from all doctors and over the counters

## 2019-06-21 NOTE — Telephone Encounter (Signed)
LVM regarding her new patient CHF Clinic appointment after her hospital d/c on 3/15 and to see how shes feeling since back home. Asked her to give [place] a call back to confirm.   Belinda Young, [place]

## 2019-06-21 NOTE — Assessment & Plan Note (Signed)
HC03   06/17/19 = 39  Newly placed on chronic 02 at d/c 06/19/19   As of 06/21/2019 rec 3lpm hs as long as no am ha/ ams and titrate daytime to low 90s

## 2019-06-21 NOTE — Assessment & Plan Note (Signed)
On ACEi as of 06/21/2019   ACE inhibitors are problematic in  pts with airway complaints because  even experienced pulmonologists can't always distinguish ace effects from copd/asthma.  By themselves they don't actually cause a problem, much like oxygen can't by itself start a fire, but they certainly serve as a powerful catalyst or enhancer for any "fire"  or inflammatory process in the upper airway, be it caused by an ET  tube or more commonly reflux (especially in the obese or pts with known GERD or who are on biphoshonates).   For any unusual coughing or unexplained sob would have a low threshold to try off acei and on ARB x 6 week trial but since she is well controlled and I am stopping dpi rx today (since dpi additive with ACEi in terms of Upper airway irritation) she should be fine on accupril for now.     Marland Kitchen

## 2019-06-21 NOTE — Assessment & Plan Note (Addendum)
Quit smoking 06/14/19 - 06/21/2019  After extensive coaching inhaler device,  effectiveness =    90% > try breztri     Group D in terms of symptom/risk and laba/lama/ICS  therefore appropriate rx at this point >>>  Breztri 2 bid      Advised:  formulary restrictions will be an ongoing challenge for the forseable future and I would be happy to pick an alternative if the pt will first  provide me a list of them -  pt  will need to return here for training for any new device that is required eg dpi vs hfa vs respimat.    In the meantime we can always provide samples so that the patient never runs out of any needed respiratory medications.   Also: Pt informed of the seriousness of COVID 19 infection as a direct risk to lung health  and safey and to close contacts and should continue to wear a facemask in public and minimize exposure to public locations but especially avoid any area or activity where non-close contacts are not observing distancing or wearing an appropriate face mask.  I strongly recommended vaccine when offered.

## 2019-06-21 NOTE — Assessment & Plan Note (Signed)
Complicated by probable OHS as of 06/2019 admit   Body mass index is 42.13 kg/m.   No results found for: TSH   Contributing to gerd risk/ doe probably as much or more than her copd /reviewed the need and the process to achieve and maintain neg calorie balance > defer f/u primary care including intermittently monitoring thyroid status           Each maintenance medication was reviewed in detail including emphasizing most importantly the difference between maintenance and prns and under what circumstances the prns are to be triggered using an action plan format where appropriate.  Total time for H and P, chart review, counseling, teaching device and generating customized AVS unique to this post hosp/ transition of care initial  office visit / charting = 60 min

## 2019-06-24 NOTE — Progress Notes (Signed)
Patient ID: LEGACY CARRENDER, female    DOB: [birth year], 54 y.o.   MRN: [id number]  HPI  Belinda Young is a 54 y/o female with a history of DM, HTN, COPD, recent tobacco use and chronic heart failure.   Echo report from 06/16/19 reviewed and showed an EF of 60-65% along with mildly enlarged right ventricle. No LVH  Admitted 06/14/19 due to acute new-onset heart failure. Initially needed IV lasix and then transitioned to oral diuretics. Net loss of ~ 4.8L. Discharged after 3 days.   She presents today for her initial visit with a chief complaint of minimal shortness of breath upon moderate exertion. She describes this as chronic in nature having been present for several years. She has associated fatigue and anxiety along with this. She denies any difficulty sleeping, dizziness, abdominal distention, palpitations, pedal edema, chest pain, cough or weight gain.   Was able to walk to our [facility] with just minimal shortness of breath.   Past Medical History:  Diagnosis Date  . Cellulitis of trunk   . CHF (congestive heart failure) (HCC)   . COPD (chronic obstructive pulmonary disease) (HCC)   . Diabetes mellitus   . Hypertension   . Pedal edema    Past Surgical History:  Procedure Laterality Date  . CESAREAN SECTION    . MASS EXCISION     spider bite in pubic area   Family History  Problem Relation Age of Onset  . COPD Mother   . Heart failure Mother   . Diabetes Mother   . Aneurysm Father    Social History   Tobacco Use  . Smoking status: Former Smoker    Packs/day: 1.50    Years: 33.00    Pack years: 49.50    Types: Cigarettes    Quit date: 06/14/2019    Years since quitting: 0.0  . Smokeless tobacco: Never Used  Substance Use Topics  . Alcohol use: Yes   No Known Allergies Prior to Admission medications   Medication Sig Start Date End Date Taking? Authorizing Provider  albuterol (PROVENTIL HFA;VENTOLIN HFA) 108 (90 BASE) MCG/ACT inhaler Inhale 2  puffs into the lungs every 6 (six) hours as needed. For SOB   Yes [provider]  albuterol (PROVENTIL) (2.5 MG/3ML) 0.083% nebulizer solution Take 2.5 mg by nebulization every 6 (six) hours as needed. For SOB   Yes [provider]  Budeson-Glycopyrrol-Formoterol (BREZTRI AEROSPHERE) 160-9-4.8 MCG/ACT AERO Inhale 2 puffs into the lungs 2 (two) times daily. 06/21/19  Yes Nyoka Cowden, MD  furosemide (LASIX) 40 MG tablet Take 1 tablet (40 mg total) by mouth daily. 06/18/19 07/18/19 Yes Zigmund Daniel., MD  gabapentin (NEURONTIN) 300 MG capsule Take 900 mg by mouth at bedtime.    Yes [provider]  ibuprofen (ADVIL,MOTRIN) 200 MG tablet Take 400 mg by mouth every 6 (six) hours as needed. For pain   Yes [provider]  Insulin Aspart (NOVOLOG FLEXPEN [place]) Inject 20 Units into the skin 3 times daily with meals, bedtime and 2 AM. 20 units Am/ 15 units at lunch / 20 units PM   Yes [provider]  insulin NPH Human (NOVOLIN N) 100 UNIT/ML injection Inject 20 Units into the skin in the morning and at bedtime. 20 units in AM/ 22 at [facility]   Yes [provider]  quinapril (ACCUPRIL) 10 MG tablet Take 20 mg by mouth at bedtime.    Yes [provider]  Review of Systems  Constitutional: Positive for fatigue. Negative for appetite change.  HENT: Negative for congestion, postnasal drip and sore throat.   Eyes: Negative.   Respiratory: Positive for shortness of breath. Negative for cough.   Cardiovascular: Negative for chest pain, palpitations and leg swelling.  Gastrointestinal: Negative for abdominal distention and abdominal pain.  Endocrine: Negative.   Genitourinary: Negative.   Musculoskeletal: Negative for back pain and neck pain.  Skin: Negative.   Allergic/Immunologic: Negative.   Neurological: Negative for dizziness and light-headedness.  Hematological: Negative for adenopathy. Does not bruise/bleed easily.   Psychiatric/Behavioral: Negative for dysphoric mood and sleep disturbance (sleeping on 2 pillows with oxygen at 3L). The patient is nervous/anxious.    Vitals:   06/25/19 0832 06/25/19 0855  BP: (!) 156/77 (!) 142/70  Pulse: 92   Resp: 20   SpO2: 90%   Weight: 267 lb 6 oz (121.3 kg)   Height: 5\' 7"  (1.702 m)    Wt Readings from Last 3 Encounters:  06/25/19 267 lb 6 oz (121.3 kg)  06/21/19 269 lb (122 kg)  06/17/19 266 lb 1.6 oz (120.7 kg)   Lab Results  Component Value Date   CREATININE 0.95 06/17/2019   CREATININE 0.89 06/16/2019   CREATININE 1.02 (H) 06/15/2019    Physical Exam Vitals and nursing note reviewed.  Constitutional:      Appearance: Normal appearance.  HENT:     Head: Normocephalic and atraumatic.  Cardiovascular:     Rate and Rhythm: Normal rate and regular rhythm.  Pulmonary:     Effort: Pulmonary effort is normal. No respiratory distress.     Breath sounds: No wheezing or rales.  Abdominal:     General: There is no distension.     Palpations: Abdomen is soft.     Tenderness: There is no abdominal tenderness.  Musculoskeletal:        General: No tenderness.     Cervical back: Normal range of motion and neck supple.     Right lower leg: No edema.     Left lower leg: No edema.  Skin:    General: Skin is warm and dry.  Neurological:     General: No focal deficit present.     Mental Status: She is alert and oriented to person, place, and time.  Psychiatric:        Mood and Affect: Mood normal.        Behavior: Behavior normal.        Thought Content: Thought content normal.     Assessment & Plan:  1: Chronic heart failure with preserved ejection fraction- - NYHA class II - euvolemic today - weighing daily and she says that her weight has been stable; instructed to call for an overnight weight gain of >2 pounds or a weekly weight gain of >5 pounds - not adding salt and has been reading food labels; reviewed the importance of closely following a  2000mg  sodium diet and written dietary information as well as a low sodium cookbook were given to her - saw cardiology ([facility]) 06/03/19 - BNP 06/16/19 was 51.0  2: HTN- - BP looks good today - follows with PCP at 08/03/19 [facility] - BMP 06/17/19 reviewed and showed sodium 139,potassium 3.7, creatinine 0.95 and GFR >60  3: DM- - glucose at home today was 120 - A1c 06/15/19 was 7.7%  4: COPD GOLD D- - saw pulmonology 06/19/19) 06/21/19 - no tobacco for the last 2 weeks (06/14/19) - wearing  oxygen at 3L around the clock although does remove it for short periods of time; she checks her oxygen sats and if it drops below 90%, she will put the oxygen back on   Medication bottles were reviewed.   Return in 2 months or sooner for any questions/problems before then.

## 2019-06-25 ENCOUNTER — Encounter: Payer: Self-pay | Admitting: Family

## 2019-06-25 ENCOUNTER — Ambulatory Visit: Payer: Medicaid Other | Attending: Family | Admitting: Family

## 2019-06-25 ENCOUNTER — Other Ambulatory Visit: Payer: Self-pay

## 2019-06-25 VITALS — BP 142/70 | HR 92 | Resp 20 | Ht 67.0 in | Wt 267.4 lb

## 2019-06-25 DIAGNOSIS — Z794 Long term (current) use of insulin: Secondary | ICD-10-CM | POA: Diagnosis not present

## 2019-06-25 DIAGNOSIS — Z87891 Personal history of nicotine dependence: Secondary | ICD-10-CM | POA: Diagnosis not present

## 2019-06-25 DIAGNOSIS — I509 Heart failure, unspecified: Secondary | ICD-10-CM | POA: Diagnosis present

## 2019-06-25 DIAGNOSIS — Z7951 Long term (current) use of inhaled steroids: Secondary | ICD-10-CM | POA: Diagnosis not present

## 2019-06-25 DIAGNOSIS — J449 Chronic obstructive pulmonary disease, unspecified: Secondary | ICD-10-CM

## 2019-06-25 DIAGNOSIS — Z833 Family history of diabetes mellitus: Secondary | ICD-10-CM | POA: Diagnosis not present

## 2019-06-25 DIAGNOSIS — I110 Hypertensive heart disease with heart failure: Secondary | ICD-10-CM | POA: Diagnosis not present

## 2019-06-25 DIAGNOSIS — I5032 Chronic diastolic (congestive) heart failure: Secondary | ICD-10-CM | POA: Insufficient documentation

## 2019-06-25 DIAGNOSIS — E119 Type 2 diabetes mellitus without complications: Secondary | ICD-10-CM | POA: Diagnosis not present

## 2019-06-25 DIAGNOSIS — Z79899 Other long term (current) drug therapy: Secondary | ICD-10-CM | POA: Diagnosis not present

## 2019-06-25 DIAGNOSIS — Z8249 Family history of ischemic heart disease and other diseases of the circulatory system: Secondary | ICD-10-CM | POA: Diagnosis not present

## 2019-06-25 DIAGNOSIS — Z825 Family history of asthma and other chronic lower respiratory diseases: Secondary | ICD-10-CM | POA: Insufficient documentation

## 2019-06-25 DIAGNOSIS — Z9981 Dependence on supplemental oxygen: Secondary | ICD-10-CM | POA: Insufficient documentation

## 2019-06-25 DIAGNOSIS — I10 Essential (primary) hypertension: Secondary | ICD-10-CM

## 2019-06-25 NOTE — Patient Instructions (Addendum)
Continue weighing daily and call for an overnight weight gain of > 2 pounds or a weekly weight gain of >5 pounds.   Drink about 60 ounces of fluid daily.

## 2019-06-27 NOTE — Progress Notes (Signed)
Cardiology Office Note    Date:  07/01/2019   ID:  Belinda Young, DOB [birth year], MRN [id number]  PCP:  Sandrea Hughs, NP  Cardiologist:  Debbe Odea, MD  Electrophysiologist:  None   Chief Complaint: Hospital follow up  History of Present Illness:   Belinda Young is a 54 y.o. female with history of HFpEF, DM, HTN, chronic hypoxic respiratory failure on supplemental oxygen via nasal cannula at 3L, COPD secondary to ongoing tobacco use with 30+ pack years, and obesity who presents for hospital follow-up as detailed below.  She established with Dr. Azucena Cecil in early 06/2019 with noted worsening lower extremity edema.  She had been placed on diuretic by PCP with symptoms initially improving though swelling persisted along the right lower extremity.  Lower extremity venous duplex was negative for DVT.  Echo was scheduled, however, prior to this being completed, she was admitted to the hospital on 3/12 through 3/17 with diastolic CHF and COPD exacerbations along with right lower extremity cellulitis.  Echo during her admission showed an EF of 60 to 65%, no regional wall motion abnormalities, normal LV diastolic function parameters, normal RV systolic function with mildly enlarged RV cavity size, mildly dilated right atrium, no significant valvular abnormality, normal size and structure of aortic root, and normal RVSF.  It was felt her symptoms were likely multifactorial including underlying COPD and acute on chronic diastolic CHF.  High-sensitivity troponin minimally elevated as outlined below and felt to be supply demand ischemia secondary to volume overload and COPD.  She was diuresed with improvement in symptoms, net -4 liters for the admission with a discharge weight of 123.2 kg.  She was discharged requiring supplemental oxygen.  Since her discharge, she established with pulmonology for management of her COPD and supplemental oxygen.  She was seen by the [facility] on 3/23 and  was felt to be euvolemic with a weight of 267 pounds. She remained on supplemental oxygen via nasal cannula at 3 L.   She comes in doing well today.  She notes since her hospital discharge her symptoms have been reasonably controlled.  However, over the past couple of days that she has noted an increase in her lower extremity swelling as well as some increased shortness of breath.  She remains on supplemental oxygen via nasal cannula.  Up until the past several days she has been able to use 1 L at rest and 2 L with ambulation though she is now using 2 L at rest and 3 L with ambulation.  She does not elevate her legs when sitting.  No compression stockings.  Her weight has trended up 3 pounds over the past 1 week.  She remains compliant with Lasix.  She did eat takeout food prior to her most recent worsening symptoms.  She drinks approximately 2 L of fluids daily.  She feels her COPD is well controlled.   Labs independently reviewed: 06/2019 - magnesium 2.1, potassium 3.6, BUN 27, serum at 0.95, albumin 3.5, AST/ALT normal, Hgb 14.1, PLT 343, BNP 51, A1c 7.7, COVID-19 negative, high-sensitivity troponin 27 with a delta of 25  Past Medical History:  Diagnosis Date   Cellulitis of trunk    CHF (congestive heart failure) (HCC)    COPD (chronic obstructive pulmonary disease) (HCC)    Diabetes mellitus    Hypertension    Pedal edema     Past Surgical History:  Procedure Laterality Date   CESAREAN SECTION     MASS EXCISION  spider bite in pubic area    Current Medications: Current Meds  Medication Sig   albuterol (PROVENTIL HFA;VENTOLIN HFA) 108 (90 BASE) MCG/ACT inhaler Inhale 2 puffs into the lungs every 6 (six) hours as needed. For SOB   albuterol (PROVENTIL) (2.5 MG/3ML) 0.083% nebulizer solution Take 2.5 mg by nebulization every 6 (six) hours as needed. For SOB   Budeson-Glycopyrrol-Formoterol (BREZTRI AEROSPHERE) 160-9-4.8 MCG/ACT AERO Inhale 2 puffs into the lungs 2 (two)  times daily.   furosemide (LASIX) 40 MG tablet Take 1 tablet (40 mg total) by mouth daily.   gabapentin (NEURONTIN) 300 MG capsule Take 900 mg by mouth at bedtime.    ibuprofen (ADVIL,MOTRIN) 200 MG tablet Take 400 mg by mouth every 6 (six) hours as needed. For pain   Insulin Aspart (NOVOLOG FLEXPEN [place]) Inject 20 Units into the skin 3 times daily with meals, bedtime and 2 AM. 20 units Am/ 15 units at lunch / 20 units PM   insulin NPH Human (NOVOLIN N) 100 UNIT/ML injection Inject 20 Units into the skin in the morning and at bedtime. 20 units in AM/ 22 at HS   quinapril (ACCUPRIL) 10 MG tablet Take 20 mg by mouth at bedtime.     Allergies:   Patient has no known allergies.   Social History   Socioeconomic History   Marital status: Married    Spouse name: Not on file   Number of children: Not on file   Years of education: Not on file   Highest education level: Not on file  Occupational History   Occupation: disabled  Tobacco Use   Smoking status: Former Smoker    Packs/day: 1.50    Years: 33.00    Pack years: 49.50    Types: Cigarettes    Quit date: 06/14/2019    Years since quitting: 0.0   Smokeless tobacco: Never Used  Substance and Sexual Activity   Alcohol use: Yes   Drug use: No   Sexual activity: Not Currently  Other Topics Concern   Not on file  Social History Narrative   Not on file   Social Determinants of Health   Financial Resource Strain:    Difficulty of Paying Living Expenses:   Food Insecurity:    Worried About Charity fundraiser in the Last Year:    Arboriculturist in the Last Year:   Transportation Needs:    Film/video editor (Medical):    Lack of Transportation (Non-Medical):   Physical Activity:    Days of Exercise per Week:    Minutes of Exercise per Session:   Stress:    Feeling of Stress :   Social Connections:    Frequency of Communication with Friends and Family:    Frequency of Social Gatherings with  Friends and Family:    Attends Religious Services:    Active Member of Clubs or Organizations:    Attends Archivist Meetings:    Marital Status:      Family History:  The patient's family history includes Aneurysm in her father; COPD in her mother; Diabetes in her mother; Heart failure in her mother.  ROS:   Review of Systems  Constitutional: Positive for malaise/fatigue. Negative for chills, diaphoresis, fever and weight loss.  HENT: Negative for congestion.   Eyes: Negative for discharge and redness.  Respiratory: Positive for shortness of breath. Negative for cough, sputum production and wheezing.   Cardiovascular: Positive for leg swelling. Negative for chest pain, palpitations, orthopnea, claudication  and PND.  Gastrointestinal: Negative for abdominal pain, heartburn, nausea and vomiting.  Musculoskeletal: Negative for falls and myalgias.  Skin: Negative for rash.  Neurological: Positive for weakness. Negative for dizziness, tingling, tremors, sensory change, speech change, focal weakness and loss of consciousness.  Endo/Heme/Allergies: Does not bruise/bleed easily.  Psychiatric/Behavioral: Negative for substance abuse. The patient is not nervous/anxious.   All other systems reviewed and are negative.    EKGs/Labs/Other Studies Reviewed:    Studies reviewed were summarized above. The additional studies were reviewed today:  2D echo 06/16/2019: 1. Left ventricular ejection fraction, by estimation, is 60 to 65%. The  left ventricle has normal function. The left ventricle has no regional  wall motion abnormalities. Left ventricular diastolic parameters were  normal.  2. Right ventricular systolic function is normal. The right ventricular  size is mildly enlarged.  3. Right atrial size was mildly dilated.  4. The mitral valve is normal in structure. No evidence of mitral valve  regurgitation. No evidence of mitral stenosis.  5. The aortic valve is normal  in structure. Aortic valve regurgitation is  not visualized. No aortic stenosis is present.    EKG:  EKG is ordered today.  The EKG ordered today demonstrates NSR, 82 bpm, no acute ST-T changes  Recent Labs: 06/16/2019: B Natriuretic Peptide 51.0 06/17/2019: ALT 16; BUN 27; Creatinine, Ser 0.95; Hemoglobin 14.1; Magnesium 2.1; Platelets 343; Potassium 3.7; Sodium 139  Recent Lipid Panel No results found for: CHOL, TRIG, HDL, CHOLHDL, VLDL, LDLCALC, LDLDIRECT  PHYSICAL EXAM:    VS:  BP 140/64 (BP Location: Left Arm, Patient Position: Sitting, Cuff Size: Large)    Pulse 82    Ht 5\' 7"  (1.702 m)    Wt 270 lb (122.5 kg)    LMP 11/02/2014 Comment: tubal ligation   SpO2 98% Comment: 3 liters of oxygen   BMI 42.29 kg/m   BMI: Body mass index is 42.29 kg/m.  Physical Exam  Constitutional: She is oriented to person, place, and time. She appears well-developed and well-nourished.  HENT:  Head: Normocephalic and atraumatic.  Eyes: Right eye exhibits no discharge. Left eye exhibits no discharge.  Neck: No JVD present.  Cardiovascular: Normal rate, regular rhythm, S1 normal, S2 normal and normal heart sounds. Exam reveals no distant heart sounds, no friction rub, no midsystolic click and no opening snap.  No murmur heard. Pulses:      Dorsalis pedis pulses are 2+ on the right side.       Posterior tibial pulses are 2+ on the right side and 2+ on the left side.  Pulmonary/Chest: Effort normal. No respiratory distress. She has no decreased breath sounds. She has wheezes. She has no rales. She exhibits no tenderness.  Abdominal: Soft. She exhibits no distension. There is no abdominal tenderness.  Musculoskeletal:        General: No edema.     Cervical back: Normal range of motion.  Neurological: She is alert and oriented to person, place, and time.  Skin: Skin is warm and dry. No cyanosis. Nails show no clubbing.  Psychiatric: She has a normal mood and affect. Her speech is normal and behavior is  normal. Judgment and thought content normal.    Wt Readings from Last 3 Encounters:  07/01/19 270 lb (122.5 kg)  06/25/19 267 lb 6 oz (121.3 kg)  06/21/19 269 lb (122 kg)     ASSESSMENT & PLAN:   1. Acute on chronic HFpEF/lower extremity edema: She appears mildly volume overloaded on  exam.  Increase Lasix to 80 mg daily for the next 2 days followed by resumption of prior dosing 40 mg daily.  Check BMP today.  Historically, when she is sitting for extended timeframes she does not elevate her legs.  Recommend she elevate legs when sitting.  Recommend she wear compression stockings, which she already has at home.  Prior lower extremity ultrasound negative for DVT.  Cellulitis has resolved.  Schedule for sleep study in follow-up.  CHF education.  2. Demand ischemia: No symptoms concerning for angina at this time.  Minimally elevated troponin felt to be secondary to COPD exacerbation and volume overload.  Plan for noninvasive ischemic testing, given her coronary artery disease risk factors, as her respiratory status improves.  3. Chronic hypoxic respiratory failure/COPD: Stable.  Followed by pulmonology.  4. Obesity: Weight loss is advised, though this was not discussed in detail at her visit today.  5. HTN: Blood pressure is minimally elevated in the office today at 140 systolic.  Escalate diuresis as above.  Remains on Accupril.  Disposition: F/u with Dr. Azucena Cecil or an APP in 1 month.   Medication Adjustments/Labs and Tests Ordered: Current medicines are reviewed at length with the patient today.  Concerns regarding medicines are outlined above. Medication changes, Labs and Tests ordered today are summarized above and listed in the Patient Instructions accessible in Encounters.   Signed, Eula Listen, PA-C 07/01/2019 10:21 AM     [facility] [place] [address] [place], [place] [address] [phone]

## 2019-07-01 ENCOUNTER — Telehealth: Payer: Self-pay | Admitting: Internal Medicine

## 2019-07-01 ENCOUNTER — Other Ambulatory Visit: Payer: Self-pay

## 2019-07-01 ENCOUNTER — Encounter: Payer: Self-pay | Admitting: Physician Assistant

## 2019-07-01 ENCOUNTER — Ambulatory Visit (INDEPENDENT_AMBULATORY_CARE_PROVIDER_SITE_OTHER): Payer: Medicaid Other | Admitting: Physician Assistant

## 2019-07-01 VITALS — BP 140/64 | HR 82 | Ht 67.0 in | Wt 270.0 lb

## 2019-07-01 DIAGNOSIS — J449 Chronic obstructive pulmonary disease, unspecified: Secondary | ICD-10-CM | POA: Diagnosis not present

## 2019-07-01 DIAGNOSIS — I5033 Acute on chronic diastolic (congestive) heart failure: Secondary | ICD-10-CM

## 2019-07-01 DIAGNOSIS — I5030 Unspecified diastolic (congestive) heart failure: Secondary | ICD-10-CM | POA: Diagnosis not present

## 2019-07-01 DIAGNOSIS — J9611 Chronic respiratory failure with hypoxia: Secondary | ICD-10-CM | POA: Diagnosis not present

## 2019-07-01 DIAGNOSIS — I248 Other forms of acute ischemic heart disease: Secondary | ICD-10-CM | POA: Diagnosis not present

## 2019-07-01 DIAGNOSIS — I10 Essential (primary) hypertension: Secondary | ICD-10-CM

## 2019-07-01 MED ORDER — BREZTRI AEROSPHERE 160-9-4.8 MCG/ACT IN AERO: 2.0000 | INHALATION_SPRAY | Freq: Two times a day (BID) | RESPIRATORY_TRACT | 0 refills | Status: DC

## 2019-07-01 NOTE — Patient Instructions (Signed)
Medication Instructions:  1- INCREASE Lasix for 2 days only to 2 tablets (80 mg total), then resume normal daily dose.  *If you need a refill on your cardiac medications before your next appointment, please call your pharmacy*   Lab Work: Your physician recommends that you have lab work today(BMET)  If you have labs (blood work) drawn today and your tests are completely normal, you will receive your results only by: Marland Kitchen MyChart Message (if you have MyChart) OR . A paper copy in the mail If you have any lab test that is abnormal or we need to change your treatment, we will call you to review the results.   Testing/Procedures: None ordered    Follow-Up: At [facility], you and your health needs are our priority.  As part of our continuing mission to provide you with exceptional heart care, we have created designated Provider Care Teams.  These Care Teams include your primary Cardiologist (physician) and Advanced Practice Providers (APPs -  Physician Assistants and Nurse Practitioners) who all work together to provide you with the care you need, when you need it.  We recommend signing up for the patient portal called "MyChart".  Sign up information is provided on this After Visit Summary.  MyChart is used to connect with patients for Virtual Visits (Telemedicine).  Patients are able to view lab/test results, encounter notes, upcoming appointments, etc.  Non-urgent messages can be sent to your provider as well.   To learn more about what you can do with MyChart, go to [email or web address].    Your next appointment:   1 month(s)  The format for your next appointment:   In Person  Provider:    You may see Debbe Odea, MD or Eula Listen, PA-C.

## 2019-07-01 NOTE — Telephone Encounter (Signed)
Samples placed up front for pt.  Pt aware.  Nothing further needed at this time- will close encounter.   

## 2019-07-02 ENCOUNTER — Telehealth: Payer: Self-pay

## 2019-07-02 LAB — BASIC METABOLIC PANEL
BUN/Creatinine Ratio: 19 (ref 9–23)
BUN: 18 mg/dL (ref 6–24)
CO2: 31 mmol/L — ABNORMAL HIGH (ref 20–29)
Calcium: 9.6 mg/dL (ref 8.7–10.2)
Chloride: 93 mmol/L — ABNORMAL LOW (ref 96–106)
Creatinine, Ser: 0.93 mg/dL (ref 0.57–1.00)
GFR calc Af Amer: 82 mL/min/{1.73_m2} (ref >59)
GFR calc non Af Amer: 71 mL/min/{1.73_m2} (ref >59)
Glucose: 194 mg/dL — ABNORMAL HIGH (ref 65–99)
Potassium: 4.4 mmol/L (ref 3.5–5.2)
Sodium: 139 mmol/L (ref 134–144)

## 2019-07-02 NOTE — Telephone Encounter (Signed)
Call to patient to review labs.    Pt verbalized understanding and has no further questions at this time.    Advised pt to call for any further questions or concerns.  No further orders.   

## 2019-07-02 NOTE — Telephone Encounter (Signed)
-----   Message from Sondra Barges, [place] sent at 07/02/2019  8:59 AM EDT ----- Random glucose is elevated with known diabetes Kidney function is normal Potassium is at goal  Recommendations: -Follow up with PCP as directed for diabetic care -Continue with planned short course of diuresis as discussed at her office visit

## 2019-07-08 ENCOUNTER — Telehealth: Payer: Self-pay | Admitting: Internal Medicine

## 2019-07-08 NOTE — Telephone Encounter (Signed)
Spoke with pt. She is aware of MW's response. Pt will need a prescription for Symbicort.  MW - please advise what strength. Thanks.

## 2019-07-08 NOTE — Telephone Encounter (Signed)
Ok to use symbicort in place of breztri until next ov

## 2019-07-08 NOTE — Telephone Encounter (Signed)
symbicort 160  Take 2 puffs first thing in am and then another 2 puffs about 12 hours later.     

## 2019-07-08 NOTE — Telephone Encounter (Signed)
Called and spoke to pt. Pt states she is not able to fill her Markus Daft as the pharmacy has questions. Called [facility] and was advised the [place] needs a PA. Pt states she was previously informed by Dr. Sherene Sires if the Markus Daft was not covered that Symbicort would be called in.   Dr. Sherene Sires please advise. Thanks.

## 2019-07-09 MED ORDER — BUDESONIDE-FORMOTEROL FUMARATE 160-4.5 MCG/ACT IN AERO: 2.0000 | INHALATION_SPRAY | Freq: Two times a day (BID) | RESPIRATORY_TRACT | 0 refills | Status: DC

## 2019-07-09 NOTE — Telephone Encounter (Signed)
Rx has been sent in per MW. 

## 2019-07-12 ENCOUNTER — Other Ambulatory Visit: Payer: Medicaid Other

## 2019-07-15 ENCOUNTER — Ambulatory Visit: Payer: Medicaid Other | Admitting: Cardiology

## 2019-07-29 ENCOUNTER — Ambulatory Visit: Payer: Medicaid Other | Admitting: Internal Medicine

## 2019-08-01 ENCOUNTER — Ambulatory Visit: Payer: Medicaid Other | Admitting: Cardiology

## 2019-08-08 ENCOUNTER — Ambulatory Visit: Payer: Medicaid Other | Admitting: Cardiology

## 2019-08-13 ENCOUNTER — Other Ambulatory Visit: Payer: Self-pay

## 2019-08-13 ENCOUNTER — Ambulatory Visit (INDEPENDENT_AMBULATORY_CARE_PROVIDER_SITE_OTHER): Payer: Medicaid Other | Admitting: Pulmonary Disease

## 2019-08-13 ENCOUNTER — Encounter: Payer: Self-pay | Admitting: Pulmonary Disease

## 2019-08-13 DIAGNOSIS — J449 Chronic obstructive pulmonary disease, unspecified: Secondary | ICD-10-CM

## 2019-08-13 DIAGNOSIS — J9612 Chronic respiratory failure with hypercapnia: Secondary | ICD-10-CM

## 2019-08-13 DIAGNOSIS — I5033 Acute on chronic diastolic (congestive) heart failure: Secondary | ICD-10-CM

## 2019-08-13 DIAGNOSIS — J9611 Chronic respiratory failure with hypoxia: Secondary | ICD-10-CM

## 2019-08-13 MED ORDER — DOXYCYCLINE HYCLATE 100 MG PO TABS: 100.0000 mg | ORAL_TABLET | Freq: Two times a day (BID) | ORAL | 0 refills | Status: DC

## 2019-08-13 MED ORDER — BENZONATATE 200 MG PO CAPS: 200.0000 mg | ORAL_CAPSULE | Freq: Three times a day (TID) | ORAL | 1 refills | Status: AC | PRN

## 2019-08-13 MED ORDER — PREDNISONE 10 MG PO TABS: ORAL_TABLET | ORAL | 0 refills | Status: DC

## 2019-08-13 NOTE — Assessment & Plan Note (Signed)
Suspected obstructive sleep apnea/OHS No baseline sleep study  Plan: Continue oxygen therapy Maintain oxygen saturations between 88 to 90% We will coordinate consult with sleep MD

## 2019-08-13 NOTE — Patient Instructions (Addendum)
You were seen today by Lauraine Rinne, NP  for:   1. COPD mixed type ([place])  - benzonatate (TESSALON) 200 MG capsule; Take 1 capsule (200 mg total) by mouth 3 (three) times daily as needed for cough.  Dispense: 30 capsule; Refill: 1  Can use Delsym over-the-counter cough medicine  Doxycycline >>> 1 100 mg tablet every 12 hours for 7 days >>>take with food  >>>wear sunscreen   Prednisone 10mg  tablet  >>>4 tabs for 2 days, then 3 tabs for 2 days, 2 tabs for 2 days, then 1 tab for 2 days, then stop >>>take with food  >>>take in the morning   Breztri >>> 2 puffs in the morning right when you wake up, rinse out your mouth after use, 12 hours later 2 puffs, rinse after use >>> Take this daily, no matter what >>> This is not a rescue inhaler   Note your daily symptoms > remember "red flags" for COPD:   >>>Increase in cough >>>increase in sputum production >>>increase in shortness of breath or activity  intolerance.   If you notice these symptoms, please call the office to be seen.    2. Chronic respiratory failure with hypoxia and hypercapnia (HCC)  Continue oxygen therapy as prescribed  >>>maintain oxygen saturations greater than 88 percent  >>>if unable to maintain oxygen saturations please contact the office  >>>do not smoke with oxygen  >>>can use nasal saline gel or nasal saline rinses to moisturize nose if oxygen causes dryness  We will coordinate getting you set up for a sleep consult with our office  3. Morbid obesity  4. Acute on chronic diastolic heart failure ([place])  Continue to weigh yourself daily  Continue to follow-up with cardiology   We recommend today:   Meds ordered this encounter  Medications  . predniSONE (DELTASONE) 10 MG tablet    Sig: 4 tabs for 2 days, then 3 tabs for 2 days, 2 tabs for 2 days, then 1 tab for 2 days, then stop    Dispense:  20 tablet    Refill:  0  . doxycycline (VIBRA-TABS) 100 MG tablet    Sig: Take 1 tablet (100 mg total) by  mouth 2 (two) times daily.    Dispense:  14 tablet    Refill:  0  . benzonatate (TESSALON) 200 MG capsule    Sig: Take 1 capsule (200 mg total) by mouth 3 (three) times daily as needed for cough.    Dispense:  30 capsule    Refill:  1    Follow Up:    Return in about 4 weeks (around 09/10/2019), or if symptoms worsen or fail to improve, for Follow up with Dr. Melvyn Novas.  71min SLEEP CONSULT at next avail   Please do your part to reduce the spread of COVID-19:      Reduce your risk of any infection  and COVID19 by using the similar precautions used for avoiding the common cold or flu:  Marland Kitchen Wash your hands often with soap and warm water for at least 20 seconds.  If soap and water are not readily available, use an alcohol-based hand sanitizer with at least 60% alcohol.  . If coughing or sneezing, cover your mouth and nose by coughing or sneezing into the elbow areas of your shirt or coat, into a tissue or into your sleeve (not your hands). Langley Gauss A MASK when in public  . Avoid shaking hands with others and consider head nods or verbal greetings  only. . Avoid touching your eyes, nose, or mouth with unwashed hands.  . Avoid close contact with people who are sick. . Avoid places or events with large numbers of people in one location, like concerts or sporting events. . If you have some symptoms but not all symptoms, continue to monitor at home and seek medical attention if your symptoms worsen. . If you are having a medical emergency, call 911.   ADDITIONAL HEALTHCARE OPTIONS FOR PATIENTS  [place] [facility] / e-Visit: [email or web address]         [facility]: [phone]  [facility]: [phone]                   [facility]: [phone]     It is flu season:   >>> Best ways to protect herself from the flu: Receive the yearly flu vaccine, practice good hand hygiene washing with soap and also using  hand sanitizer when available, eat a nutritious meals, get adequate rest, hydrate appropriately   Please contact the office if your symptoms worsen or you have concerns that you are not improving.   Thank you for choosing [facility] for your healthcare, and for allowing [place] to partner with you on your healthcare journey. I am thankful to be able to provide care to you today.   Elisha Headland FNP-C

## 2019-08-13 NOTE — Progress Notes (Signed)
Virtual Visit via Telephone Note  I connected with Belinda Young on 08/13/19 at 11:30 AM EDT by telephone and verified that I am speaking with the correct person using two identifiers.  Location: Patient: Home Provider: Office [facility] - [address] [place], [address], [place], [place] [address]   I discussed the limitations, risks, security and privacy concerns of performing an evaluation and management service by telephone and the availability of in person appointments. I also discussed with the patient that there may be a patient responsible charge related to this service. The patient expressed understanding and agreed to proceed.  Patient consented to consult via telephone: Yes People present and their role in pt care: Pt     History of Present Illness:  54 year old female former smoker initially consulted with our practice in March/2021 followed for COPD, chronic respiratory failure  Past medical history: Heart failure, obesity, hypertension Smoking history: Former smoker.  Quit March/2021.  49.5-pack-year smoking history Maintenance: Breztri  Patient of Dr. Sherene Sires  Chief complaint: Short of Breath   54 year old female former smoker followed in our office for COPD and chronic respiratory failure.  Patient was recently hospitalized earlier this year with suspected obesity hypoventilation syndrome.  She has not been scheduled for a sleep study yet.  We will work to coordinate this.  Patient reports that for the last 1 to 2 weeks has had increased cough and congestion with now brown mucus.  This is a change from her baseline.  She does typically have a baseline cough but has clear sputum.  Patient reports that she is making more sputum than usual as well as the sputum color has changed.  She reports adherence to [facility] inhalers.  She continues to not smoke.  No one around her smoking.  She has had to resume her oxygen therapy.  She is currently maintained on 3 L and her oxygen  saturations are 90 to 92%.  She reports that for a while she was actually off of oxygen and only having to occasionally use with physical exertion.  Now she is back to 24/7 use.  She reports her weights are stable.  Her weight is 272 pounds today.  She monitors this with close follow-up with cardiology.  She reports adherence to her medications from cardiology.  She denies chest pain or heart racing.  She does have a persistent cough at night.  She is not using any over-the-counter cough medicines at this time.  We will discuss this today.  Observations/Objective:  08/13/19 - Sp02 - 90-92 (3L)  08/13/19 - weight - 272lb   06/16/2019-chest x-ray-mild CHF suggested on prior study as improved or resolved  06/16/2019-echocardiogram-LV ejection fraction 60 to 65%, right ventricular systolic function is normal, right ventricle size is mildly enlarged  Social History   Tobacco Use  Smoking Status Former Smoker  . Packs/day: 1.50  . Years: 33.00  . Pack years: 49.50  . Types: Cigarettes  . Quit date: 06/14/2019  . Years since quitting: 0.1  Smokeless Tobacco Never Used   Immunization History  Administered Date(s) Administered  . Influenza, Seasonal, Injecte, Preservative Fre 07/19/2011  . Influenza-Unspecified 01/03/2019      Assessment and Plan:  Heart failure (HCC) Unable to fully assess telephonically today Patient reporting stable weights Patient reports that she does not have lower extremity swelling  Plan: Continue medications as prescribed Continue follow-up with cardiology  COPD GOLD D  Plan: Doxycycline today Prednisone taper today Tessalon Perles to help with cough  Can use Delsym over-the-counter cough syrup Explained the patient that we will not prescribe narcotic cough syrup at this time especially given the fact that it would likely worsen her suspected OSA/OHS.  Patient needs to utilize over-the-counter strategies first Keep follow-up in June/2021 with Dr.  Melvyn Novas   Chronic respiratory failure with hypoxia and hypercapnia ([place]) Suspected obstructive sleep apnea/OHS No baseline sleep study  Plan: Continue oxygen therapy Maintain oxygen saturations between 88 to 90% We will coordinate consult with sleep MD  Morbid obesity due to excess calories ([place]) Morbid obesity High likelihood of OSA/OHS  Plan: Work on reducing BMI Work on increasing overall physical activity We will coordinate sleep consult   Follow Up Instructions:  Return in about 4 weeks (around 09/10/2019), or if symptoms worsen or fail to improve, for Follow up with Dr. Melvyn Novas.   I discussed the assessment and treatment plan with the patient. The patient was provided an opportunity to ask questions and all were answered. The patient agreed with the plan and demonstrated an understanding of the instructions.   The patient was advised to call back or seek an in-person evaluation if the symptoms worsen or if the condition fails to improve as anticipated.  I provided 28 minutes of non-face-to-face time during this encounter.   Lauraine Rinne, NP

## 2019-08-13 NOTE — Assessment & Plan Note (Signed)
Morbid obesity High likelihood of OSA/OHS  Plan: Work on reducing BMI Work on increasing overall physical activity We will coordinate sleep consult

## 2019-08-13 NOTE — Assessment & Plan Note (Signed)
Unable to fully assess telephonically today Patient reporting stable weights Patient reports that she does not have lower extremity swelling  Plan: Continue medications as prescribed Continue follow-up with cardiology

## 2019-08-13 NOTE — Assessment & Plan Note (Signed)
Plan: Doxycycline today Prednisone taper today Tessalon Perles to help with cough Can use Delsym over-the-counter cough syrup Explained the patient that we will not prescribe narcotic cough syrup at this time especially given the fact that it would likely worsen her suspected OSA/OHS.  Patient needs to utilize over-the-counter strategies first Keep follow-up in June/2021 with Dr. Sherene Sires

## 2019-08-16 ENCOUNTER — Encounter: Payer: Medicaid Other | Attending: Cardiology

## 2019-08-19 ENCOUNTER — Ambulatory Visit: Payer: Medicaid Other | Admitting: Cardiology

## 2019-08-20 ENCOUNTER — Ambulatory Visit: Payer: Medicaid Other

## 2019-08-26 ENCOUNTER — Ambulatory Visit: Payer: Medicaid Other | Admitting: Family

## 2019-08-30 ENCOUNTER — Ambulatory Visit (INDEPENDENT_AMBULATORY_CARE_PROVIDER_SITE_OTHER): Payer: Medicaid Other | Admitting: Cardiology

## 2019-08-30 ENCOUNTER — Other Ambulatory Visit: Payer: Self-pay

## 2019-08-30 ENCOUNTER — Encounter: Payer: Self-pay | Admitting: Cardiology

## 2019-08-30 VITALS — BP 120/64 | HR 88 | Ht 67.0 in | Wt 275.1 lb

## 2019-08-30 DIAGNOSIS — Z87891 Personal history of nicotine dependence: Secondary | ICD-10-CM | POA: Diagnosis not present

## 2019-08-30 DIAGNOSIS — R600 Localized edema: Secondary | ICD-10-CM

## 2019-08-30 MED ORDER — FUROSEMIDE 40 MG PO TABS: 40.0000 mg | ORAL_TABLET | ORAL | 3 refills | Status: DC | PRN

## 2019-08-30 NOTE — Patient Instructions (Signed)
Medication Instructions:  No changes  *If you need a refill on your cardiac medications before your next appointment, please call your pharmacy*   Lab Work: None If you have labs (blood work) drawn today and your tests are completely normal, you will receive your results only by: . MyChart Message (if you have MyChart) OR . A paper copy in the mail If you have any lab test that is abnormal or we need to change your treatment, we will call you to review the results.   Testing/Procedures: None   Follow-Up: At [facility], you and your health needs are our priority.  As part of our continuing mission to provide you with exceptional heart care, we have created designated Provider Care Teams.  These Care Teams include your primary Cardiologist (physician) and Advanced Practice Providers (APPs -  Physician Assistants and Nurse Practitioners) who all work together to provide you with the care you need, when you need it.  We recommend signing up for the patient portal called "MyChart".  Sign up information is provided on this After Visit Summary.  MyChart is used to connect with patients for Virtual Visits (Telemedicine).  Patients are able to view lab/test results, encounter notes, upcoming appointments, etc.  Non-urgent messages can be sent to your provider as well.   To learn more about what you can do with MyChart, go to [email or web address].    Your next appointment:   6 month(s)  The format for your next appointment:   In Person  Provider:    You may see Brian Agbor-Etang, MD or one of the following Advanced Practice Providers on your designated Care Team:    Christopher Berge, NP  Ryan Dunn, PA-C  Jacquelyn Visser, PA-C  

## 2019-08-30 NOTE — Progress Notes (Signed)
Cardiology Office Note:    Date:  08/30/2019   ID:  Belinda Young, DOB 1966/02/29, MRN [id number]  PCP:  Freddy Finner, NP  Cardiologist:  Kate Sable, MD  Electrophysiologist:  None   Referring MD: Freddy Finner, NP   Chief Complaint  Patient presents with  . OTHER    1 month f/u no complaints today. Meds reviewed verbally with pt.    History of Present Illness:    Belinda Young is a 54 y.o. female with a hx of COPD on 3L [place], diabetes, former smoker x30+ years, morbidly obese who presents for follow-up.  She was previously seen due to lower extremity edema improved after diuresing.  Currently on oral Lasix as needed which has helped her edema.  Last echocardiogram showed normal systolic and diastolic function, EF 60 to 65%.  Patient stopped smoking 2 months ago.  She takes lisinopril for her kidneys due to being a diabetic.  She otherwise feels okay, feels much better after stopping smoking.    Past Medical History:  Diagnosis Date  . Cellulitis of trunk   . CHF (congestive heart failure) ([place])   . COPD (chronic obstructive pulmonary disease) ([place])   . Diabetes mellitus   . Hypertension   . Pedal edema     Past Surgical History:  Procedure Laterality Date  . CESAREAN SECTION    . MASS EXCISION     spider bite in pubic area    Current Medications: Current Meds  Medication Sig  . albuterol (PROVENTIL HFA;VENTOLIN HFA) 108 (90 BASE) MCG/ACT inhaler Inhale 2 puffs into the lungs every 6 (six) hours as needed. For SOB  . albuterol (PROVENTIL) (2.5 MG/3ML) 0.083% nebulizer solution Take 2.5 mg by nebulization every 6 (six) hours as needed. For SOB  . benzonatate (TESSALON) 200 MG capsule Take 1 capsule (200 mg total) by mouth 3 (three) times daily as needed for cough.  . Budeson-Glycopyrrol-Formoterol (BREZTRI AEROSPHERE) 160-9-4.8 MCG/ACT AERO Inhale 2 puffs into the lungs 2 (two) times daily.  . furosemide (LASIX) 40 MG tablet Take 1 tablet (40 mg total) by mouth as  needed.  . gabapentin (NEURONTIN) 300 MG capsule Take 900 mg by mouth at bedtime.   Marland Kitchen ibuprofen (ADVIL,MOTRIN) 200 MG tablet Take 400 mg by mouth every 6 (six) hours as needed. For pain  . Insulin Aspart (NOVOLOG FLEXPEN [place]) Inject 20 Units into the skin 3 times daily with meals, bedtime and 2 AM. 20 units Am/ 15 units at lunch / 20 units PM  . insulin NPH Human (NOVOLIN N) 100 UNIT/ML injection Inject 20 Units into the skin in the morning and at bedtime. 20 units in AM/ 22 at HS  . quinapril (ACCUPRIL) 10 MG tablet Take 20 mg by mouth at bedtime.   . [DISCONTINUED] furosemide (LASIX) 40 MG tablet Take 1 tablet (40 mg total) by mouth daily. (Patient taking differently: Take 40 mg by mouth as needed. )     Allergies:   Patient has no known allergies.   Social History   Socioeconomic History  . Marital status: Married    Spouse name: Not on file  . Number of children: Not on file  . Years of education: Not on file  . Highest education level: Not on file  Occupational History  . Occupation: disabled  Tobacco Use  . Smoking status: Former Smoker    Packs/day: 1.50    Years: 33.00    Pack years: 49.50    Types: Cigarettes  Quit date: 06/14/2019    Years since quitting: 0.2  . Smokeless tobacco: Never Used  Substance and Sexual Activity  . Alcohol use: Yes  . Drug use: No  . Sexual activity: Not Currently  Other Topics Concern  . Not on file  Social History Narrative  . Not on file   Social Determinants of Health   Financial Resource Strain:   . Difficulty of Paying Living Expenses:   Food Insecurity:   . Worried About Programme researcher, broadcasting/film/video in the Last Year:   . Barista in the Last Year:   Transportation Needs:   . Freight forwarder (Medical):   Marland Kitchen Lack of Transportation (Non-Medical):   Physical Activity:   . Days of Exercise per Week:   . Minutes of Exercise per Session:   Stress:   . Feeling of Stress :   Social Connections:   . Frequency of  Communication with Friends and Family:   . Frequency of Social Gatherings with Friends and Family:   . Attends Religious Services:   . Active Member of Clubs or Organizations:   . Attends Banker Meetings:   Marland Kitchen Marital Status:      Family History: The patient's family history includes Aneurysm in her father; COPD in her mother; Diabetes in her mother; Heart failure in her mother.  ROS:   Please see the history of present illness.     All other systems reviewed and are negative.  EKGs/Labs/Other Studies Reviewed:    The following studies were reviewed today:   EKG:  EKG is  ordered today.  The ekg ordered today demonstrates normal sinus rhythm, low voltage QRS.  Recent Labs: 06/16/2019: B Natriuretic Peptide 51.0 06/17/2019: ALT 16; Hemoglobin 14.1; Magnesium 2.1; Platelets 343 07/01/2019: BUN 18; Creatinine, Ser 0.93; Potassium 4.4; Sodium 139  Recent Lipid Panel No results found for: CHOL, TRIG, HDL, CHOLHDL, VLDL, LDLCALC, LDLDIRECT  Physical Exam:    VS:  BP 120/64 (BP Location: Left Arm, Patient Position: Sitting, Cuff Size: Large)   Pulse 88   Ht 5\' 7"  (1.702 m)   Wt 275 lb 2 oz (124.8 kg)   LMP 11/02/2014 Comment: tubal ligation  SpO2 96%   BMI 43.09 kg/m     Wt Readings from Last 3 Encounters:  08/30/19 275 lb 2 oz (124.8 kg)  07/01/19 270 lb (122.5 kg)  06/25/19 267 lb 6 oz (121.3 kg)     GEN:  Well nourished, well developed in no acute distress HEENT: Normal NECK: No JVD; No carotid bruits  LYMPHATICS: No lymphadenopathy CARDIAC: RRR, no murmurs, rubs, gallops RESPIRATORY: Decreased breath sounds bilaterally, no wheezing noted. ABDOMEN: Soft, non-tender, distended/obese MUSCULOSKELETAL: Trace edema SKIN: Warm and dry NEUROLOGIC:  Alert and oriented x 3 PSYCHIATRIC:  Normal affect   ASSESSMENT:    1. Edema, lower extremity   2. Morbid obesity (HCC)   3. Former smoker    PLAN:    In order of problems listed above:  1. Patient with  history of lower extremity edema, improved with Lasix.  Echocardiogram with normal systolic and diastolic function.  Symptoms of edema likely due to dietary indiscretion and obesity.  No edema noted on today's exam.  Patient advised to take Lasix as needed for edema. 2. Patient is morbidly obese.  Low calorie diet and weight loss advised.  Over 5 minutes spent counseling patient. 3. Patient is a former smoker.  She stopped smoking 2 months ago.  She was congratulated  on this good achievement and encouraged to stay tobacco free.  Follow-up in 6 months.  This note was generated in part or whole with voice recognition software. Voice recognition is usually quite accurate but there are transcription errors that can and very often do occur. I apologize for any typographical errors that were not detected and corrected.  Medication Adjustments/Labs and Tests Ordered: Current medicines are reviewed at length with the patient today.  Concerns regarding medicines are outlined above.  Orders Placed This Encounter  Procedures  . EKG 12-Lead   Meds ordered this encounter  Medications  . furosemide (LASIX) 40 MG tablet    Sig: Take 1 tablet (40 mg total) by mouth as needed.    Dispense:  90 tablet    Refill:  3    Patient Instructions  Medication Instructions:  No changes  *If you need a refill on your cardiac medications before your next appointment, please call your pharmacy*   Lab Work: None  If you have labs (blood work) drawn today and your tests are completely normal, you will receive your results only by: Marland Kitchen MyChart Message (if you have MyChart) OR . A paper copy in the mail If you have any lab test that is abnormal or we need to change your treatment, we will call you to review the results.   Testing/Procedures: None   Follow-Up: At [facility], you and your health needs are our priority.  As part of our continuing mission to provide you with exceptional heart care, we have  created designated Provider Care Teams.  These Care Teams include your primary Cardiologist (physician) and Advanced Practice Providers (APPs -  Physician Assistants and Nurse Practitioners) who all work together to provide you with the care you need, when you need it.  We recommend signing up for the patient portal called "MyChart".  Sign up information is provided on this After Visit Summary.  MyChart is used to connect with patients for Virtual Visits (Telemedicine).  Patients are able to view lab/test results, encounter notes, upcoming appointments, etc.  Non-urgent messages can be sent to your provider as well.   To learn more about what you can do with MyChart, go to [email or web address].    Your next appointment:   6 month(s)  The format for your next appointment:   In Person  Provider:    You may see Debbe Odea, MD or one of the following Advanced Practice Providers on your designated Care Team:    Nicolasa Ducking, NP  Eula Listen, PA-C  Marisue Ivan, PA-C      Signed, Debbe Odea, MD  08/30/2019 12:37 PM    [facility]

## 2019-09-03 ENCOUNTER — Ambulatory Visit: Payer: Medicaid Other | Admitting: Internal Medicine

## 2019-09-10 ENCOUNTER — Ambulatory Visit (INDEPENDENT_AMBULATORY_CARE_PROVIDER_SITE_OTHER): Payer: Medicaid Other

## 2019-09-10 ENCOUNTER — Encounter: Payer: Self-pay | Admitting: Internal Medicine

## 2019-09-10 ENCOUNTER — Ambulatory Visit (INDEPENDENT_AMBULATORY_CARE_PROVIDER_SITE_OTHER): Payer: Medicaid Other | Admitting: Internal Medicine

## 2019-09-10 ENCOUNTER — Other Ambulatory Visit: Payer: Self-pay

## 2019-09-10 DIAGNOSIS — B370 Candidal stomatitis: Secondary | ICD-10-CM | POA: Diagnosis not present

## 2019-09-10 DIAGNOSIS — J449 Chronic obstructive pulmonary disease, unspecified: Secondary | ICD-10-CM

## 2019-09-10 DIAGNOSIS — I10 Essential (primary) hypertension: Secondary | ICD-10-CM | POA: Diagnosis not present

## 2019-09-10 DIAGNOSIS — J9612 Chronic respiratory failure with hypercapnia: Secondary | ICD-10-CM

## 2019-09-10 DIAGNOSIS — J9611 Chronic respiratory failure with hypoxia: Secondary | ICD-10-CM | POA: Diagnosis not present

## 2019-09-10 MED ORDER — BREZTRI AEROSPHERE 160-9-4.8 MCG/ACT IN AERO: 2.0000 | INHALATION_SPRAY | Freq: Two times a day (BID) | RESPIRATORY_TRACT | 0 refills | Status: DC

## 2019-09-10 MED ORDER — IRBESARTAN 150 MG PO TABS: 150.0000 mg | ORAL_TABLET | Freq: Every day | ORAL | 11 refills | Status: AC

## 2019-09-10 MED ORDER — CLOTRIMAZOLE 10 MG MT TROC: 10.0000 mg | Freq: Every day | OROMUCOSAL | 11 refills | Status: AC

## 2019-09-10 NOTE — Assessment & Plan Note (Signed)
HC03   06/17/19 = 39  Newly placed on chronic 02 at d/c 06/19/19    As of 09/10/2019  Needs 1.5 lpm hs and titrate daytime to keep sats > 90%  Also needs ono on current flow rate

## 2019-09-10 NOTE — Assessment & Plan Note (Signed)
Clotrimazole prn and use better oral hygiene pf application of breztri to include baking soda based toothpaste         Each maintenance medication was reviewed in detail including emphasizing most importantly the difference between maintenance and prns and under what circumstances the prns are to be triggered using an action plan format where appropriate.  Total time for H and P, chart review, counseling, teaching device and generating customized AVS unique to this office visit / charting = 30 min

## 2019-09-10 NOTE — Progress Notes (Signed)
Belinda Young, female    DOB: September 07, 1965, 54 y.o.   MRN: [id number]   Brief patient profile:  36 yowf quit smoking 06/14/19 at admit on advair/spiriva dpi / ACEi   Admit date: 06/14/2019 Discharge date: 06/19/2019   Recommendations for Outpatient Follow-up:  1. Follow outpatient CBC/CMP 2. Follow volume status outpatient 3. Follow lasix dose, lytes, creatinine 4. Follow cardiology outpatient 5. Folllow long term oxygen need - COPD - follow up pulm outpatient 6. Follow sleep study outpatient 7. Follow ischemic cardiac evaluation outpatient per cardiology 8. Follow up outpatient per cardiology  Discharge Diagnoses:  Active Problems:   Acute CHF (congestive heart failure) ([place])           Filed Weights   06/15/19 1853 06/16/19 0355 06/17/19 0444  Weight: 124.1 kg 123.2 kg 120.7 kg    History of present illness:  [name] a53 y.o.Caucasian femalewith a known history of COPD, type diabetes mellitus and morbid obesity, who presented to the emergency room with acute onset of worsening dyspnea over the last couple weeks with associated orthopnea and paroxysmal nocturnal dyspnea as well as worsening lower extremity edema. She admitted to dry cough as well as wheezing. She was admitted for new heart failure.  She was admitted for new onset heart failure.  She was also treated for a COPD exacerbation.  Echo with normal EF.  Diuresed with IV lasix and transitioned to PO at discharge.  She required supplemental oxygen at discharge.  See below for additional details    Hospital Course:  1. Acute new onset CHF - Follow echo - normal ef (see report below) - continue 40 IV lasix BID -> transition to PO lasix at discharge - troponins minimally elevated, likely demand ischemia from HF exacerbation -cardiology c/s, appreciate recs - transition to PO lasix, follow outpatient sleep study, needs outpatient cardiac evaluation - echo with normal EF, normal diastolic  parameters - I/O, daily weights- net negative 4.8 L, weight down to 123.2 kg - 3/12 CXR with early CHF -> repeat CXR on 3/14with improved CHF  2. COPD probably with mild exacerbation. -treated for COPD exacerbation - required supplemental O2 at discharge - follow outpatient with cards  # RLE Cellulitis: improved, continue to monitor - continue abx  3. Tobacco abuse. -She stated that she quit couple of weeks ago. -encourage cessation  4. Type 2 diabetes mellitus. -The patient will be placed on supplemental coverage with NovoLog. -Hold oral home meds  5. Hypertension. -We will continue her lisinopril- on hold  Procedures: Echo IMPRESSIONS    1. Left ventricular ejection fraction, by estimation, is 60 to 65%. The  left ventricle has normal function. The left ventricle has no regional  wall motion abnormalities. Left ventricular diastolic parameters were  normal.  2. Right ventricular systolic function is normal. The right ventricular  size is mildly enlarged.  3. Right atrial size was mildly dilated.  4. The mitral valve is normal in structure. No evidence of mitral valve  regurgitation. No evidence of mitral stenosis.  5. The aortic valve is normal in structure. Aortic valve regurgitation is  not visualized. No aortic stenosis is present.       History of Present Illness  06/21/2019  Pulmonary/ 1st office eval/Kaelon Weekes  Chief Complaint  Patient presents with   Pulmonary Consult    recent admission to [facility] for COPD. She was sent home on o2 3lpm 24/7. She states that her breathing has improved since hospital d/c. She is using her albuterol  inhaler 7 x per wk and neb with albuterol 3 x per day.   Dyspnea:  Around the house on 3lpm sats ok  Cough: none Sleep: on Left side/ bed is flat 2 pillws SABA use: as above -way too much, poor insight into maint vs prn  02 3lpm 24/7  rec Plan A = Automatic = Always=    Breztri (symbicor 160 )Take 2 puffs first  thing in am and then another 2 puffs about 12 hours later.  Work on inhaler technique:   Stop your advair and spirva  - call me with any issues with getting Breztri thru insurance Plan B = Backup (to supplement plan A, not to replace it) Only use your albuterol inhaler as a rescue medication Plan C = Crisis (instead of Plan B but only if Plan B stops working) - only use your albuterol nebulizer if you first try Plan B  02 3lpm at bedtime and we'll do ONO on 3lpm  02 adjust 02 to saturations 90-94% no higher during the day  Please schedule a follow up office visit in 4 weeks, sooner if needed  with all medications /inhalers/ solutions in hand so we can verify exactly what you are taking. This includes all medications from all doctors and over the counters   08/13/19 NP rec - benzonatate (TESSALON) 200 MG capsule; Take 1 capsule (200 mg total) by mouth 3 (three) times daily as needed for cough.  Dispense: 30 capsule; Refill: 1 Can use Delsym over-the-counter cough medicine Doxycycline >>> 1 100 mg tablet every 12 hours for 7 days >>>take with food  >>>wear sunscreen  Prednisone 10mg  tablet  >>>4 tabs for 2 days, then 3 tabs for 2 days, 2 tabs for 2 days, then 1 tab for 2 days, then stop >>>take with food  >>>take in the morning  Breztri >>> 2 puffs in the morning right when you wake up, rinse out your mouth after use, 12 hours later 2 puffs, rinse after use >>> Take this daily, no matter what >>> This is not a rescue inhaler   2. Chronic respiratory failure with hypoxia and hypercapnia (HCC) Continue oxygen therapy as prescribed  >>>maintain oxygen saturations greater than 88 percent  >>>if unable to maintain oxygen saturations please contact the office  >>>do not smoke with oxygen  >>>can use nasal saline gel or nasal saline rinses to moisturize nose if oxygen causes dryness We will coordinate getting you set up for a sleep consult with our office   09/10/2019  f/u ov/Pelagia Iacobucci re:  No  vaccination yet/  Copd ? Stage off cigs x 3 months but on ACEi  Chief Complaint  Patient presents with   Follow-up    4wk f/u for COPD. States she her breathing has been stable for the past few weeks.    Dyspnea:  2lpm walking does not chaeck sats, very inactive  Cough: some at hs harsh dry  Sleeping: on flat bed 2 pillows  SABA use: no need 02: 1.5lpm hs and 2lpm    No obvious day to day or daytime variability or assoc excess/ purulent sputum or mucus plugs or hemoptysis or cp or chest tightness, subjective wheeze or overt sinus or hb symptoms.   Sleep without nocturnal  or early am exacerbation  of respiratory  c/o's or need for noct saba. Also denies any obvious fluctuation of symptoms with weather or environmental changes or other aggravating or alleviating factors except as outlined above   No unusual exposure hx  or h/o childhood pna/ asthma or knowledge of premature birth.  Current Allergies, Complete Past Medical History, Past Surgical History, Family History, and Social History were reviewed in [employer] record.  ROS  The following are not active complaints unless bolded Hoarseness, sore throat/thrush, dysphagia, dental problems, itching, sneezing,  nasal congestion or discharge of excess mucus or purulent secretions, ear ache,   fever, chills, sweats, unintended wt loss or wt gain, classically pleuritic or exertional cp,  orthopnea pnd or arm/hand swelling  or leg swelling, presyncope, palpitations, abdominal pain, anorexia, nausea, vomiting, diarrhea  or change in bowel habits or change in bladder habits, change in stools or change in urine, dysuria, hematuria,  rash, arthralgias, visual complaints, headache, numbness, weakness or ataxia or problems with walking or coordination,  change in mood or  memory.        Current Meds  Medication Sig   albuterol (PROVENTIL HFA;VENTOLIN HFA) 108 (90 BASE) MCG/ACT inhaler Inhale 2 puffs into the lungs every 6 (six)  hours as needed. For SOB   albuterol (PROVENTIL) (2.5 MG/3ML) 0.083% nebulizer solution Take 2.5 mg by nebulization every 6 (six) hours as needed. For SOB   benzonatate (TESSALON) 200 MG capsule Take 1 capsule (200 mg total) by mouth 3 (three) times daily as needed for cough.   Budeson-Glycopyrrol-Formoterol (BREZTRI AEROSPHERE) 160-9-4.8 MCG/ACT AERO Inhale 2 puffs into the lungs 2 (two) times daily.   furosemide (LASIX) 40 MG tablet Take 1 tablet (40 mg total) by mouth as needed.   gabapentin (NEURONTIN) 300 MG capsule Take 900 mg by mouth at bedtime.    ibuprofen (ADVIL,MOTRIN) 200 MG tablet Take 400 mg by mouth every 6 (six) hours as needed. For pain   Insulin Aspart (NOVOLOG FLEXPEN [place]) Inject 20 Units into the skin 3 times daily with meals, bedtime and 2 AM. 20 units Am/ 15 units at lunch / 20 units PM   insulin NPH Human (NOVOLIN N) 100 UNIT/ML injection Inject 20 Units into the skin in the morning and at bedtime. 20 units in AM/ 22 at HS   quinapril (ACCUPRIL) 10 MG tablet Take 20 mg by mouth at bedtime.           CXR PA and Lateral:   09/10/2019 :    I personally reviewed images and  impression as follows:   Non specific RN changes bilaterally      Objective:    Obese hoarse pleasant wf / classic pseudowheeze     09/10/2019         281   08/30/19 275 lb 2 oz (124.8 kg)  07/01/19 270 lb (122.5 kg)  06/25/19 267 lb 6 oz (121.3 kg)     Vital signs reviewed - Note on arrival 02 sats  96% on 2lpm NP         HEENT : pt wearing mask not removed for exam due to covid - 19 concerns.    NECK :  without JVD/Nodes/TM/ nl carotid upstrokes bilaterally   LUNGS: no acc muscle use,  Mild barrel  contour chest wall with bilateral  Distant bs s audible wheeze and  without cough on insp or exp maneuvers  and mild  Hyperresonant  to  percussion bilaterally     CV:  RRR  no s3 or murmur or increase in P2, and  1+ pitting despite elastic hose (no lasix yet on day of ov)   ABD:   soft and nontender with pos end  insp Hoover's  in the supine position. No bruits or organomegaly appreciated, bowel sounds nl  MS:   Nl gait/  ext warm without deformities, calf tenderness, cyanosis or clubbing No obvious joint restrictions   SKIN: warm and dry without lesions    NEURO:  alert, approp, nl sensorium with  no motor or cerebellar deficits apparent.                             Assessment   COPD GOLD D  Quit smoking 06/14/19 - 06/21/2019  After extensive coaching inhaler device,  effectiveness =    90% > try breztri        Chronic respiratory failure with hypoxia and hypercapnia (HCC) HC03   06/17/19 = 39  Newly placed on chronic 02 at d/c 06/19/19   As of 06/21/2019 rec 3lpm hs as long as no am ha/ ams and titrate daytime to low 90s    Morbid obesity due to excess calories (HCC) Complicated by probable OHS as of 06/2019 admit         Essential hypertension On ACEi as of 06/21/2019   A

## 2019-09-10 NOTE — Assessment & Plan Note (Signed)
Quit smoking 06/14/19 - 06/21/2019    breztri trial - 09/10/2019  After extensive coaching inhaler device,  effectiveness =    75% (ti)     Group D in terms of symptom/risk and laba/lama/ICS  therefore appropriate rx at this point >>>  Continue breztri with prn clotrimazole for thrush and re-inforced optimal oral hygience  Pt informed of the seriousness of COVID 19 infection as a direct risk to lung health  and safey and to close contacts and should continue to wear a facemask in public and minimize exposure to public locations but especially avoid any area or activity where non-close contacts are not observing distancing or wearing an appropriate face mask.  I strongly recommended vaccine after discussed benefits vs risks.

## 2019-09-10 NOTE — Assessment & Plan Note (Signed)
On ACEi as of 06/21/2019 > d/c'd 09/10/2019   In the best review of chronic cough to date ( NEJM 2016 375 1694-5038) ,  ACEi are now felt to cause cough in up to  20% of pts which is a 4 fold increase from previous reports and does not include the variety of non-specific complaints we see in pulmonary clinic in pts on ACEi but previously attributed to another dx like  Copd/asthma and  include PNDS, sore throat and chest congestion, "bronchitis", unexplained dyspnea and noct "strangling" sensations, and hoarseness, but also  atypical /refractory GERD symptoms like dysphagia and "bad heartburn"   The only way I know  to prove this is not an "ACEi Case" is a trial off ACEi x a minimum of 6 weeks then regroup.  Try avapro 150 mg one daily and f/u in 6 weeks

## 2019-09-10 NOTE — Patient Instructions (Addendum)
I strongly recommend the vaccine thru drugstore    Plan A = Automatic = Always=    breztri  Take 2 puffs first thing in am and then another 2 puffs about 12 hours later.   Work on inhaler technique:  relax and gently blow all the way out then take a nice smooth deep breath back in, triggering the inhaler at same time you start breathing in.  Hold for up to 5 seconds if you can. Blow out thru nose. Rinse and gargle with water when done- arm and hammer toothpaste     Plan B = Backup (to supplement plan A, not to replace it) Only use your albuterol inhaler as a rescue medication to be used if you can't catch your breath by resting or doing a relaxed purse lip breathing pattern.  - The less you use it, the better it will work when you need it. - Ok to use the inhaler up to 2 puffs  every 4 hours if you must but call for appointment if use goes up over your usual need - Don't leave home without it !!  (think of it like the spare tire for your car)   Plan C = Crisis (instead of Plan B but only if Plan B stops working) - only use your albuterol nebulizer if you first try Plan B and it fails to help > ok to use the nebulizer up to every 4 hours but if start needing it regularly call for immediate appointment  Stop accupril    Avapro (ibesartan)  150 mg one daily - ok to break in half too strong    Clotrimazole 10 mg troche one five times   Make sure you check your oxygen saturations at highest level of activity to be sure it stays over 90% and adjust upward to maintain this level if needed but remember to turn it back to previous settings when you stop (to conserve your supply).   We will arrange an overnight pulse oxymetry on your present 02 flow    Please remember to go to the  x-ray department  for your tests - we will call you with the results when they are available    Please schedule a follow up office visit in 6 weeks, call sooner if needed

## 2019-09-11 NOTE — Progress Notes (Signed)
Spoke with pt and notified of results per Dr. Wert. Pt verbalized understanding and denied any questions. 

## 2019-09-19 ENCOUNTER — Ambulatory Visit: Payer: Medicaid Other | Admitting: Family

## 2019-11-19 ENCOUNTER — Encounter: Payer: Self-pay | Admitting: Internal Medicine

## 2019-12-05 ENCOUNTER — Telehealth: Payer: Self-pay | Admitting: Internal Medicine

## 2019-12-05 DIAGNOSIS — J9611 Chronic respiratory failure with hypoxia: Secondary | ICD-10-CM

## 2019-12-05 NOTE — Telephone Encounter (Signed)
Then needs 3lpm and repeat on 3lpm

## 2019-12-05 NOTE — Telephone Encounter (Signed)
ONO done by [facility] 11/19/19- documented to be done on RA Per MW- pt needs 2 lpm o2 with sleep and repeat ONO on the 2lpm   I spoke with the pt and let her know this and she states that the test was actually done while on her o2 at 1 lpm  Will forward to Dr Sherene Sires to let him know and be sure this does not change his recommendations   Please advise, thanks

## 2019-12-05 NOTE — Telephone Encounter (Signed)
Called and spoke with patient.  Dr Thurston Hole recommendations given.  Understanding stated.  ONO placed for O2 3 LPM.  Nothing further at this time.

## 2020-02-06 ENCOUNTER — Encounter: Payer: Self-pay | Admitting: Internal Medicine

## 2020-03-02 ENCOUNTER — Ambulatory Visit: Payer: Medicaid Other | Admitting: Cardiology

## 2020-03-09 ENCOUNTER — Ambulatory Visit: Payer: Medicaid Other | Admitting: Cardiology

## 2020-03-13 ENCOUNTER — Ambulatory Visit: Payer: Medicaid Other | Admitting: Cardiology

## 2020-03-16 ENCOUNTER — Telehealth: Payer: Self-pay | Admitting: Internal Medicine

## 2020-03-16 MED ORDER — AZITHROMYCIN 250 MG PO TABS: ORAL_TABLET | ORAL | 0 refills | Status: DC

## 2020-03-16 MED ORDER — PREDNISONE 10 MG PO TABS: ORAL_TABLET | ORAL | 0 refills | Status: AC

## 2020-03-16 NOTE — Telephone Encounter (Signed)
Called and spoke with pt letting her know that we were going to send Rx for zpak and pred taper to pharmacy for her and she verbalized understanding. Verified preferred pharmacy and sent both meds in for pt. Nothing further needed.

## 2020-03-16 NOTE — Telephone Encounter (Signed)
Spoke with pt, c/o sinus congestion, pnd, prod cough with green/yellow mucus, chest soreness from coughing. Denies fever.  States that her daughter had a cold last week, was covid tested and came back negative.  Then pt started experiencing these s/s, present X3 days. States she started with sinus congestion and feels like it's moving into her chest.  Requesting a pred taper and abx.  Taking maintenance Breztri, mucinex 600mg  bid, increased O2 to 3lpm 24/7 d/t dyspnea.  Pt normally wears 2lpm with exertion.    Pharmacy: [facility] [place].    MW please advise on recs.  Thanks!

## 2020-03-16 NOTE — Telephone Encounter (Signed)
zpak Prednisone 10 mg take  4 each am x 2 days,   2 each am x 2 days,  1 each am x 2 days and stop  

## 2020-03-30 ENCOUNTER — Telehealth: Payer: Self-pay | Admitting: Internal Medicine

## 2020-03-30 ENCOUNTER — Other Ambulatory Visit: Payer: Self-pay | Admitting: Internal Medicine

## 2020-03-30 MED ORDER — BREZTRI AEROSPHERE 160-9-4.8 MCG/ACT IN AERO: 2.0000 | INHALATION_SPRAY | Freq: Two times a day (BID) | RESPIRATORY_TRACT | 0 refills | Status: DC

## 2020-03-30 NOTE — Telephone Encounter (Signed)
Spoke with the pt Rx refilled x 1 only pending her appt with MW 04/06/20  Nothing further needed

## 2020-04-06 ENCOUNTER — Ambulatory Visit: Payer: Medicaid Other | Admitting: Primary Care

## 2020-04-07 ENCOUNTER — Other Ambulatory Visit: Payer: Self-pay | Admitting: Internal Medicine

## 2020-04-07 ENCOUNTER — Ambulatory Visit (INDEPENDENT_AMBULATORY_CARE_PROVIDER_SITE_OTHER): Payer: Medicaid Other | Admitting: Internal Medicine

## 2020-04-07 ENCOUNTER — Encounter: Payer: Self-pay | Admitting: Internal Medicine

## 2020-04-07 ENCOUNTER — Other Ambulatory Visit: Payer: Self-pay

## 2020-04-07 ENCOUNTER — Ambulatory Visit (INDEPENDENT_AMBULATORY_CARE_PROVIDER_SITE_OTHER): Payer: Medicaid Other

## 2020-04-07 DIAGNOSIS — J9612 Chronic respiratory failure with hypercapnia: Secondary | ICD-10-CM

## 2020-04-07 DIAGNOSIS — J441 Chronic obstructive pulmonary disease with (acute) exacerbation: Secondary | ICD-10-CM

## 2020-04-07 DIAGNOSIS — J449 Chronic obstructive pulmonary disease, unspecified: Secondary | ICD-10-CM

## 2020-04-07 DIAGNOSIS — J9611 Chronic respiratory failure with hypoxia: Secondary | ICD-10-CM | POA: Diagnosis not present

## 2020-04-07 MED ORDER — PREDNISONE 10 MG PO TABS: ORAL_TABLET | ORAL | 0 refills | Status: DC

## 2020-04-07 MED ORDER — AZITHROMYCIN 250 MG PO TABS: 250.0000 mg | ORAL_TABLET | ORAL | 0 refills | Status: DC

## 2020-04-07 NOTE — Progress Notes (Signed)
Belinda Young, female    DOB: 1965/07/19, 55 y.o.   MRN: [id number]   Brief patient profile:  54 yowf quit smoking 06/14/19 at admit on advair/spiriva dpi / ACEi   Admit date: 06/14/2019 Discharge date: 06/19/2019   Recommendations for Outpatient Follow-up:  1. Follow outpatient CBC/CMP 2. Follow volume status outpatient 3. Follow lasix dose, lytes, creatinine 4. Follow cardiology outpatient 5. Folllow long term oxygen need - COPD - follow up pulm outpatient 6. Follow sleep study outpatient 7. Follow ischemic cardiac evaluation outpatient per cardiology 8. Follow up outpatient per cardiology  Discharge Diagnoses:  Active Problems:   Acute CHF (congestive heart failure) (HCC)           Filed Weights   06/15/19 1853 06/16/19 0355 06/17/19 0444  Weight: 124.1 kg 123.2 kg 120.7 kg    History of present illness:  Belinda Young a53 y.o.Caucasian femalewith a known history of COPD, type diabetes mellitus and morbid obesity, who presented to the emergency room with acute onset of worsening dyspnea over the last couple weeks with associated orthopnea and paroxysmal nocturnal dyspnea as well as worsening lower extremity edema. She admitted to dry cough as well as wheezing. She was admitted for new heart failure.  She was admitted for new onset heart failure.  She was also treated for a COPD exacerbation.  Echo with normal EF.  Diuresed with IV lasix and transitioned to PO at discharge.  She required supplemental oxygen at discharge.  See below for additional details    Hospital Course:  1. Acute new onset CHF - Follow echo - normal ef (see report below) - continue 40 IV lasix BID -> transition to PO lasix at discharge - troponins minimally elevated, likely demand ischemia from HF exacerbation -cardiology c/s, appreciate recs - transition to PO lasix, follow outpatient sleep study, needs outpatient cardiac evaluation - echo with normal EF, normal diastolic  parameters - I/O, daily weights- net negative 4.8 L, weight down to 123.2 kg - 3/12 CXR with early CHF -> repeat CXR on 3/14with improved CHF  2. COPD probably with mild exacerbation. -treated for COPD exacerbation - required supplemental O2 at discharge - follow outpatient with cards  # RLE Cellulitis: improved, continue to monitor - continue abx  3. Tobacco abuse. -She stated that she quit couple of weeks ago. -encourage cessation  4. Type 2 diabetes mellitus. -The patient will be placed on supplemental coverage with NovoLog. -Hold oral home meds  5. Hypertension. -We will continue her lisinopril- on hold  Procedures: Echo IMPRESSIONS    1. Left ventricular ejection fraction, by estimation, is 60 to 65%. The  left ventricle has normal function. The left ventricle has no regional  wall motion abnormalities. Left ventricular diastolic parameters were  normal.  2. Right ventricular systolic function is normal. The right ventricular  size is mildly enlarged.  3. Right atrial size was mildly dilated.  4. The mitral valve is normal in structure. No evidence of mitral valve  regurgitation. No evidence of mitral stenosis.  5. The aortic valve is normal in structure. Aortic valve regurgitation is  not visualized. No aortic stenosis is present.       History of Present Illness  06/21/2019  Pulmonary/ 1st office eval/Sumedh Shinsato  Chief Complaint  Patient presents with  . Pulmonary Consult    recent admission to [facility] for COPD. She was sent home on o2 3lpm 24/7. She states that her breathing has improved since hospital d/c. She is using her albuterol  inhaler 7 x per wk and neb with albuterol 3 x per day.   Dyspnea:  Around the house on 3lpm sats ok  Cough: none Sleep: on Left side/ bed is flat 2 pillws SABA use: as above -way too much, poor insight into maint vs prn  02 3lpm 24/7  rec Plan A = Automatic = Always=    Breztri (symbicor 160 )Take 2 puffs first  thing in am and then another 2 puffs about 12 hours later.  Work on inhaler technique:   Stop your advair and spirva  - call me with any issues with getting Breztri thru insurance Plan B = Backup (to supplement plan A, not to replace it) Only use your albuterol inhaler as a rescue medication Plan C = Crisis (instead of Plan B but only if Plan B stops working) - only use your albuterol nebulizer if you first try Plan B  02 3lpm at bedtime and we'll do ONO on 3lpm  02 adjust 02 to saturations 90-94% no higher during the day  Please schedule a follow up office visit in 4 weeks, sooner if needed  with all medications /inhalers/ solutions in hand so we can verify exactly what you are taking. This includes all medications from all doctors and over the counters   08/13/19 NP rec - benzonatate (TESSALON) 200 MG capsule; Take 1 capsule (200 mg total) by mouth 3 (three) times daily as needed for cough.  Dispense: 30 capsule; Refill: 1 Can use Delsym over-the-counter cough medicine Doxycycline >>> 1 100 mg tablet every 12 hours for 7 days >>>take with food  >>>wear sunscreen  Prednisone 10mg  tablet  >>>4 tabs for 2 days, then 3 tabs for 2 days, 2 tabs for 2 days, then 1 tab for 2 days, then stop >>>take with food  >>>take in the morning  Breztri >>> 2 puffs in the morning right when you wake up, rinse out your mouth after use, 12 hours later 2 puffs, rinse after use >>> Take this daily, no matter what >>> This is not a rescue inhaler   2. Chronic respiratory failure with hypoxia and hypercapnia (HCC) Continue oxygen therapy as prescribed  >>>maintain oxygen saturations greater than 88 percent  >>>if unable to maintain oxygen saturations please contact the office  >>>do not smoke with oxygen  >>>can use nasal saline gel or nasal saline rinses to moisturize nose if oxygen causes dryness We will coordinate getting you set up for a sleep consult with our office   09/10/2019  f/u ov/Rozella Servello re:  No  vaccination yet/  Copd ? Stage off cigs x 3 months but on ACEi  Chief Complaint  Patient presents with  . Follow-up    4wk f/u for COPD. States she her breathing has been stable for the past few weeks.    Dyspnea:  2lpm walking does not check sats, very inactive  Cough: some at hs harsh dry  Sleeping: on flat bed 2 pillows  SABA use: no need 02: 1.5lpm hs and 2lpm  rec I strongly recommend the vaccine thru drugstore  Plan A = Automatic = Always=    Breztri  Take 2 puffs first thing in am and then another 2 puffs about 12 hours later.  Work on inhaler technique:   Plan B = Backup (to supplement plan A, not to replace it) Only use your albuterol inhaler as a rescue medication  Plan C = Crisis (instead of Plan B but only if Plan B stops working) -  only use your albuterol nebulizer if you first try Plan B Stop accupril  Avapro (ibesartan)  150 mg one daily - ok to break in half too strong  Clotrimazole 10 mg troche one five times  Make sure you check your oxygen saturations at highest level of activity  We will arrange an overnight pulse oxymetry on your present 02 flow        04/07/2020 acute  ov/Lyriq Finerty re: sick since 03/14/20 rx  03/16/20 with pred /zpak  Chief Complaint  Patient presents with  . Acute Visit    Increased SOB since "had a cold" 2-3 wks ago. She states having a lot of chest congestion and some cough with minimal clear sputum.  She is using her albuterol inhaler 2 x per day and has not used her neb.   Dyspnea: still able to house cleaning  And go 50 ft to mb flat surface  Cough: thick mucus s discoloration  Sleeping: flat bed, 2 pillows  SABA use: as above - has not used neb yet  02: 1.5 hs and none at rest and 2lpm walking    No obvious day to day or daytime variability or assoc excess/ purulent sputum or mucus plugs or hemoptysis or cp or chest tightness, subjective wheeze or overt sinus or hb symptoms.   Sleeping as above  without nocturnal  or early am exacerbation   of respiratory  c/o's or need for noct saba. Also denies any obvious fluctuation of symptoms with weather or environmental changes or other aggravating or alleviating factors except as outlined above   No unusual exposure hx or h/o childhood pna/ asthma or knowledge of premature birth.  Current Allergies, Complete Past Medical History, Past Surgical History, Family History, and Social History were reviewed in [employer] record.  ROS  The following are not active complaints unless bolded Hoarseness, sore throat, dysphagia, dental problems, itching, sneezing,  nasal congestion or discharge of excess mucus or purulent secretions, ear ache,   fever, chills, sweats, unintended wt loss or wt gain, classically pleuritic or exertional cp,  orthopnea pnd or arm/hand swelling  or leg swelling, presyncope, palpitations, abdominal pain, anorexia, nausea, vomiting, diarrhea  or change in bowel habits or change in bladder habits, change in stools or change in urine, dysuria, hematuria,  rash, arthralgias, visual complaints, headache, numbness, weakness or ataxia or problems with walking or coordination,  change in mood or  memory.        Current Meds  Medication Sig  . albuterol (PROVENTIL HFA;VENTOLIN HFA) 108 (90 BASE) MCG/ACT inhaler Inhale 2 puffs into the lungs every 6 (six) hours as needed. For SOB  . albuterol (PROVENTIL) (2.5 MG/3ML) 0.083% nebulizer solution Take 2.5 mg by nebulization every 6 (six) hours as needed. For SOB  . benzonatate (TESSALON) 200 MG capsule Take 1 capsule (200 mg total) by mouth 3 (three) times daily as needed for cough.  . Budeson-Glycopyrrol-Formoterol (BREZTRI AEROSPHERE) 160-9-4.8 MCG/ACT AERO Inhale 2 puffs into the lungs 2 (two) times daily.  . clotrimazole (MYCELEX) 10 MG troche Take 1 tablet (10 mg total) by mouth 5 (five) times daily.  Marland Kitchen gabapentin (NEURONTIN) 300 MG capsule Take 900 mg by mouth at bedtime.   Marland Kitchen ibuprofen (ADVIL,MOTRIN) 200 MG  tablet Take 400 mg by mouth every 6 (six) hours as needed. For pain  . Insulin Aspart (NOVOLOG FLEXPEN [place]) Inject 20 Units into the skin 3 times daily with meals, bedtime and 2 AM. 20 units Am/ 15 units  at lunch / 20 units PM  . insulin NPH Human (NOVOLIN N) 100 UNIT/ML injection Inject 20 Units into the skin in the morning and at bedtime. 20 units in AM/ 22 at HS  . irbesartan (AVAPRO) 150 MG tablet Take 1 tablet (150 mg total) by mouth daily.  . predniSONE (DELTASONE) 10 MG tablet Take  4 each am x 2 days,   2 each am x 2 days,  1 each am x 2 days and stop             Objective:       04/07/2020         283  09/10/2019         281   08/30/19 275 lb 2 oz (124.8 kg)  07/01/19 270 lb (122.5 kg)  06/25/19 267 lb 6 oz (121.3 kg)      Vital signs reviewed  04/07/2020  - Note at rest 02 sats  97% on RA   General appearance:    Obese amb slt hoarse wf nad         HEENT : pt wearing mask not removed for exam due to covid - 19 concerns.    NECK :  without JVD/Nodes/TM/ nl carotid upstrokes bilaterally   LUNGS: no acc muscle use,  Mild barrel  contour chest wall with bilateral  Distant exp sonorous rhonchi    and  With  cough on exp maneuvers     CV:  RRR  no s3 or murmur or increase in P2, and no edema   ABD:  Obese soft and nontender with pos end  insp Hoover's  in the supine position. No bruits or organomegaly appreciated, bowel sounds nl  MS:   Nl gait/  ext warm without deformities, calf tenderness, cyanosis or clubbing No obvious joint restrictions   SKIN: warm and dry without lesions    NEURO:  alert, approp, nl sensorium with  no motor or cerebellar deficits apparent.              CXR PA and Lateral:   04/07/2020 :    I personally reviewed images and agree with radiology impression as follows:    Mild reticulonodular opacities of the lungs which appears worse than the comparison, potentially multifocal pneumonia or consequence of the low lung volumes and atelectasis. My  review: no def as dz/ small lung volumes                 Assessment

## 2020-04-07 NOTE — Patient Instructions (Addendum)
Try prilosec otc 20mg   Take 30-60 min before first meal of the day and Pepcid ac (famotidine) 20 mg one @  bedtime until cough is completely gone for at least a week without the need for cough suppression  Best cough medication >  mucinex dm 1200 mg every 12 hours with water  Prednisone 10 mg take  4 each am x 2 days,   2 each am x 2 days,  1 each am x 2 days and stop    I very strongly recommend you get the moderna or pfizer vaccine as soon as possible based on your risk of dying from the virus  and the proven safety and benefit of these vaccines against even the delta and omicron variant.  This can save your life as well as  those of your loved ones,  especially if they are also not vaccinated.    Please remember to go to the  x-ray department  for your tests - we will call you with the results when they are available     Please schedule a follow up visit in 3 months but call sooner if needed - with pfts on return Add:   zpak based on cxr

## 2020-04-07 NOTE — Assessment & Plan Note (Signed)
Flare since 03/14/20  Already rx with zpak / pred x 6 d since 03/16/20 - 04/07/2020 rec pred x 6 days plus gerd rx plus mucinex dm   cxr c/w acute bronchitis s def as dz > add empirical zpak

## 2020-04-07 NOTE — Assessment & Plan Note (Signed)
Quit smoking 06/14/19 - 06/21/2019    breztri trial - 09/10/2019  After extensive coaching inhaler device,  effectiveness =    75% (ti)   Had been doing well on breztri maint c/w group D so continue this plus prn saba.  When hfa not working well reminded to change to neb up to q4 h per ABC action plan already reviewed in writing  Patient is unvaccinated and was informed of the seriousness of COVID 19 infection as a direct risk to lung health as well as safety to close contacts and should continue to wear a facemask in public and minimize exposure to public locations but especially avoid any area or activity where non-close contacts are not observing distancing or wearing an appropriate face mask.  I strongly recommended pt take either of the vaccines available through local drugstores based on updated information on millions of Americans treated with the Moderna and [employer] products  which have proven both safe and  effective even against the new delta and omicron variants.

## 2020-04-07 NOTE — Assessment & Plan Note (Signed)
HC03   06/17/19 = 39 -Newly placed on chronic 02 at d/c 06/19/19   - ONO on 02 done 11/19/19   desat < 89% x  58 min 40 sec so 12/05/2019  rec 2lpm hs and repeat on 2lpm   - ONO on 3lpm  02/06/20 No desats > continue 3lpm q hs   Reminded should be on 3lpm hs and f/u in 3 m with pfts          Each maintenance medication was reviewed in detail including emphasizing most importantly the difference between maintenance and prns and under what circumstances the prns are to be triggered using an action plan format where appropriate.  Total time for H and P, chart review, counseling, teaching devices (02 and hfa)  and generating customized AVS unique to this office visit / charting = 32 min

## 2020-04-07 NOTE — Progress Notes (Signed)
Spoke with pt and notified of results per Dr. Sherene Sires. Pt verbalized understanding and denied any questions. Rx for zpack was sent.

## 2020-04-08 ENCOUNTER — Encounter: Payer: Self-pay | Admitting: Internal Medicine

## 2020-04-29 ENCOUNTER — Telehealth: Payer: Self-pay | Admitting: Internal Medicine

## 2020-04-29 DIAGNOSIS — J449 Chronic obstructive pulmonary disease, unspecified: Secondary | ICD-10-CM

## 2020-04-29 MED ORDER — PREDNISONE 10 MG PO TABS: ORAL_TABLET | ORAL | 0 refills | Status: DC

## 2020-04-29 MED ORDER — BREZTRI AEROSPHERE 160-9-4.8 MCG/ACT IN AERO: 2.0000 | INHALATION_SPRAY | Freq: Two times a day (BID) | RESPIRATORY_TRACT | 6 refills | Status: AC

## 2020-04-29 NOTE — Telephone Encounter (Signed)
Called and spoke with pt letting her know the info stated by MW that he was fine with [place] sending another round of prednisone and then after that if no better we would need to get her in for an OV. Pt verbalized understanding. Verified preferred pharmacy and sent Rx in for pt. Nothing further needed.

## 2020-04-29 NOTE — Telephone Encounter (Signed)
Ok for one more round of pred then ov:  Prednisone 10 mg take  4 each am x 2 days,   2 each am x 2 days,  1 each am x 2 days and stop

## 2020-04-29 NOTE — Telephone Encounter (Signed)
ATC patient unable to reach LM to call back office (x1)  

## 2020-04-29 NOTE — Telephone Encounter (Signed)
Patient is returning phone call. Patient phone number is [phone].

## 2020-04-29 NOTE — Telephone Encounter (Signed)
04/29/2020  Contacted patient.  She is requesting refill of Breztri inhaler.  I have sent this refill over to the patient's preferred pharmacy [facility].  Patient reporting productive cough with thick yellow to green mucus.  Patient is status post a Z-Pak with abnormal chest x-ray in January/2022 when last seen by Dr. Sherene Sires.  Patient is wondering if she should have another round of prednisone.  Patient feels that this would be beneficial for her.  She reports that the mucus is very thick and hard to bring up.  She denies any fevers.  Attempted to schedule patient for follow-up with Dr. Work in February/2022.  Patient reports that she will call back to get this scheduled as she does not have her calendar in front of her.  Dr. Sherene Sires please advise if you would like for the patient be treated with an additional round of prednisone.  Elisha Headland, FNP

## 2020-04-30 ENCOUNTER — Telehealth: Payer: Self-pay | Admitting: Internal Medicine

## 2020-04-30 NOTE — Telephone Encounter (Signed)
Checked [email or web address], PA has been approved until 04/30/2021.

## 2020-04-30 NOTE — Telephone Encounter (Signed)
PA started on [email or web address]. Key is [id number]. Will check back later for response.

## 2020-05-26 ENCOUNTER — Telehealth: Payer: Self-pay | Admitting: Internal Medicine

## 2020-05-26 DIAGNOSIS — J9611 Chronic respiratory failure with hypoxia: Secondary | ICD-10-CM

## 2020-05-29 NOTE — Telephone Encounter (Signed)
lmtcb for pt.  Unable to locate who her DME company is.

## 2020-06-01 NOTE — Telephone Encounter (Signed)
I have placed the order for best fit eval with [facility]  LMTCB to make pt aware

## 2020-06-01 NOTE — Telephone Encounter (Signed)
Order a best fit for portable 02 to see if it's the right choice/ flow rate for her

## 2020-06-01 NOTE — Telephone Encounter (Signed)
Spoke with the pt  She is requesting order for POC to go to [facility]  She states that she was started on o2 in the hospital and they originally ordered it  She wants to be able to exercise so needing something more light weight  Please advise if this is okay, thanks

## 2020-06-03 NOTE — Telephone Encounter (Signed)
Call made to patient, confirmed DOB. Made aware order has been placed and sent to [facility]. Voiced understanding.   Nothing further needed at this time.

## 2020-07-02 ENCOUNTER — Other Ambulatory Visit (HOSPITAL_COMMUNITY): Payer: Medicaid Other

## 2020-07-06 ENCOUNTER — Ambulatory Visit: Payer: Medicaid Other | Admitting: Internal Medicine

## 2020-07-10 ENCOUNTER — Other Ambulatory Visit (HOSPITAL_COMMUNITY)
Admission: RE | Admit: 2020-07-10 | Discharge: 2020-07-10 | Disposition: A | Discharge status: Home / Self Care | Payer: Medicaid Other | Source: Ambulatory Visit | Attending: Acute Care | Admitting: Acute Care

## 2020-07-10 ENCOUNTER — Other Ambulatory Visit (HOSPITAL_COMMUNITY): Payer: Medicaid Other

## 2020-07-10 ENCOUNTER — Other Ambulatory Visit: Payer: Self-pay | Admitting: Internal Medicine

## 2020-07-10 DIAGNOSIS — Z01812 Encounter for preprocedural laboratory examination: Secondary | ICD-10-CM | POA: Diagnosis not present

## 2020-07-10 DIAGNOSIS — Z20822 Contact with and (suspected) exposure to covid-19: Secondary | ICD-10-CM | POA: Diagnosis not present

## 2020-07-10 DIAGNOSIS — J9611 Chronic respiratory failure with hypoxia: Secondary | ICD-10-CM

## 2020-07-10 DIAGNOSIS — J9612 Chronic respiratory failure with hypercapnia: Secondary | ICD-10-CM

## 2020-07-10 DIAGNOSIS — J449 Chronic obstructive pulmonary disease, unspecified: Secondary | ICD-10-CM

## 2020-07-11 LAB — SARS CORONAVIRUS 2 (TAT 6-24 HRS): SARS Coronavirus 2: NEGATIVE

## 2020-07-13 ENCOUNTER — Other Ambulatory Visit: Payer: Self-pay | Admitting: Acute Care

## 2020-07-13 ENCOUNTER — Encounter: Payer: Self-pay | Admitting: Acute Care

## 2020-07-13 ENCOUNTER — Ambulatory Visit (INDEPENDENT_AMBULATORY_CARE_PROVIDER_SITE_OTHER): Payer: Medicaid Other | Admitting: Internal Medicine

## 2020-07-13 ENCOUNTER — Ambulatory Visit (INDEPENDENT_AMBULATORY_CARE_PROVIDER_SITE_OTHER): Payer: Medicaid Other | Admitting: Acute Care

## 2020-07-13 ENCOUNTER — Ambulatory Visit: Payer: Medicaid Other | Admitting: Internal Medicine

## 2020-07-13 ENCOUNTER — Other Ambulatory Visit: Payer: Self-pay

## 2020-07-13 VITALS — BP 122/74 | HR 72 | Temp 98.1°F | Ht 65.0 in | Wt 279.2 lb

## 2020-07-13 DIAGNOSIS — J449 Chronic obstructive pulmonary disease, unspecified: Secondary | ICD-10-CM

## 2020-07-13 DIAGNOSIS — Z87891 Personal history of nicotine dependence: Secondary | ICD-10-CM

## 2020-07-13 DIAGNOSIS — J9611 Chronic respiratory failure with hypoxia: Secondary | ICD-10-CM

## 2020-07-13 DIAGNOSIS — I509 Heart failure, unspecified: Secondary | ICD-10-CM

## 2020-07-13 DIAGNOSIS — R0600 Dyspnea, unspecified: Secondary | ICD-10-CM

## 2020-07-13 DIAGNOSIS — J9612 Chronic respiratory failure with hypercapnia: Secondary | ICD-10-CM | POA: Diagnosis not present

## 2020-07-13 DIAGNOSIS — R0609 Other forms of dyspnea: Secondary | ICD-10-CM

## 2020-07-13 LAB — PULMONARY FUNCTION TEST
DL/VA % pred: 92 %
DL/VA: 3.92 ml/min/mmHg/L
DLCO cor % pred: 59 %
DLCO cor: 12.9 ml/min/mmHg
DLCO unc % pred: 60 %
DLCO unc: 13.17 ml/min/mmHg
FEF 25-75 Post: 0.28 L/sec
FEF 25-75 Pre: 0.26 L/sec
FEF2575-%Change-Post: 7 %
FEF2575-%Pred-Post: 10 %
FEF2575-%Pred-Pre: 9 %
FEV1-%Change-Post: 10 %
FEV1-%Pred-Post: 21 %
FEV1-%Pred-Pre: 19 %
FEV1-Post: 0.62 L
FEV1-Pre: 0.57 L
FEV1FVC-%Change-Post: 5 %
FEV1FVC-%Pred-Pre: 43 %
FEV6-%Change-Post: 2 %
FEV6-%Pred-Post: 44 %
FEV6-%Pred-Pre: 43 %
FEV6-Post: 1.56 L
FEV6-Pre: 1.53 L
FEV6FVC-%Change-Post: -2 %
FEV6FVC-%Pred-Post: 94 %
FEV6FVC-%Pred-Pre: 96 %
FVC-%Change-Post: 4 %
FVC-%Pred-Post: 46 %
FVC-%Pred-Pre: 44 %
FVC-Post: 1.7 L
FVC-Pre: 1.63 L
Post FEV1/FVC ratio: 37 %
Post FEV6/FVC ratio: 92 %
Pre FEV1/FVC ratio: 35 %
Pre FEV6/FVC Ratio: 94 %
RV % pred: 200 %
RV: 3.77 L
TLC % pred: 106 %
TLC: 5.54 L

## 2020-07-13 MED ORDER — BREZTRI AEROSPHERE 160-9-4.8 MCG/ACT IN AERO: 2.0000 | INHALATION_SPRAY | Freq: Two times a day (BID) | RESPIRATORY_TRACT | 0 refills | Status: DC

## 2020-07-13 MED ORDER — BREZTRI AEROSPHERE 160-9-4.8 MCG/ACT IN AERO: 2.0000 | INHALATION_SPRAY | Freq: Two times a day (BID) | RESPIRATORY_TRACT | 0 refills | Status: AC

## 2020-07-13 NOTE — Patient Instructions (Addendum)
It is nice to meet you today. Your PFT's confirm Severe COPD, with moderately impaired DLCO.  Please continue your Breztri 2 puffs in the morning and 2 puffs at bedtime.  Rinse mouth after use.  Use albuterol as needed for break through shortness of breath or wheezing. Continue wearing your oxygen as you have been doing. Saturation goals are 88-92%. Continue wearing oxygen at bedtime as you have been doing.   We will send an order for an incentive spirometer. Suck on this like a cigarette. This will open alveoli at the bases of your lungs and help with oxygen exchange. We will order a flutter valve  Blow into this, and use Mucinex ( Plain blue package) when you have chest congestion and cannot get mucus up.  Use several times a day, as needed for the above. We will refer you to [facility]. We will refer you to Pulmonary Rehab ( Under Dr. Sherene Sires)  You will get a call to schedule I am unsure how well this is covered by Medicaid but we will try.  Note your daily symptoms > remember "red flags" for COPD:  Increase in cough, increase in sputum production, increase in shortness of breath or activity intolerance. If you notice these symptoms, please call to be seen. Follow up with Dr. Sherene Sires or Maralyn Sago NP  in 3 months We will refer you to the Lung Cancer Screening Program. You will get a call to get this scheduled.  Call us sooner if you need [place] sooner.  Please contact office for sooner follow up if symptoms do not improve or worsen or seek emergency care

## 2020-07-13 NOTE — Progress Notes (Signed)
PFT done today. 

## 2020-07-13 NOTE — Addendum Note (Signed)
Addended bySandra Cockayne on: 07/13/2020 02:22 PM   Modules accepted: Orders

## 2020-07-13 NOTE — Progress Notes (Signed)
History of Present Illness Belinda Young is a 55 y.o. female former smoker ( Quit with 06/14/2019 with a 49.5 pack year smoking history) ) with COPD, DM II and heart failure. She was initially referred to our practice 06/2019 after hospitalization for COPD. Heart Failure, and chronic respiratory failure. She was  placed on chronic oxygen 06/2019 at hospital discharge. She is followed by Dr. Sherene Sires.  Maintenance > Breztri She has  requested a POC be ordered so that she can exercise. ( Ordered 05/2020)   07/13/2020  Pt. Presents for PFT's. She was last seen in the clinic 04/07/2020.Per my chart review, she has never had PFT testing to evaluate her Lung Disease. PFT's confirm severe disease. She is compliant with her Breztri. She states this medication has made a real difference in her breathing . She states she uses her rescue inhaler once every few days. She has not had to use her nebulized albuterol in months, and that was a day that the air was heavy. She states she does not have a lot of mucus production. She is not coughing much either. She is compliant wearing her oxygen at 3 L when she is walking around, and not at rest. She states she does have occasional wheezing. No fever or chest pain. No swelling in her ankles today. She is compliant with her lasix.She would like to have an Facilities manager, and a flutter valve so she can work on pulmonary toilet at home.             Test Results: PFT 07/13/2020      HC03   06/17/19 = 39 -Newly placed on chronic 02 at d/c 06/19/19   - ONO on 02 done 11/19/19   desat < 89% x  58 min 40 sec so 12/05/2019  rec 2lpm hs and repeat on 2lpm   - ONO on 3lpm  02/06/20 No desats > continue 3lpm q hs   CBC Latest Ref Rng & Units 06/17/2019 06/16/2019 06/14/2019  WBC 4.0 - 10.5 K/uL 9.8 10.1 11.1(H)  Hemoglobin 12.0 - 15.0 g/dL 26.8 34.1 96.2  Hematocrit 36.0 - 46.0 % 47.9(H) 46.2(H) 47.0(H)  Platelets 150 - 400 K/uL 343 315 300    BMP Latest Ref Rng & Units  07/01/2019 06/17/2019 06/16/2019  Glucose 65 - 99 mg/dL 229(N) 989(Q) 119(E)  BUN 6 - 24 mg/dL 18 17(E) 20  Creatinine 0.57 - 1.00 mg/dL 0.81 4.48 1.85  BUN/Creat Ratio 9 - 23 19 - -  Sodium 134 - 144 mmol/L 139 139 138  Potassium 3.5 - 5.2 mmol/L 4.4 3.7 3.9  Chloride 96 - 106 mmol/L 93(L) 87(L) 88(L)  CO2 20 - 29 mmol/L 31(H) 39(H) 39(H)  Calcium 8.7 - 10.2 mg/dL 9.6 9.1 8.9    BNP    Component Value Date/Time   BNP 51.0 06/16/2019 0459    ProBNP No results found for: PROBNP  PFT No results found for: FEV1PRE, FEV1POST, FVCPRE, FVCPOST, TLC, DLCOUNC, PREFEV1FVCRT, PSTFEV1FVCRT  No results found.   Past medical hx Past Medical History:  Diagnosis Date  . Cellulitis of trunk   . CHF (congestive heart failure) (HCC)   . COPD (chronic obstructive pulmonary disease) (HCC)   . Diabetes mellitus   . Hypertension   . Pedal edema      Social History   Tobacco Use  . Smoking status: Former Smoker    Packs/day: 1.50    Years: 33.00    Pack years: 49.50    Types:  Cigarettes    Quit date: 06/14/2019    Years since quitting: 1.0  . Smokeless tobacco: Never Used  Vaping Use  . Vaping Use: Never used  Substance Use Topics  . Alcohol use: Yes  . Drug use: No    [name] reports that she quit smoking about 12 months ago. Her smoking use included cigarettes. She has a 49.50 pack-year smoking history. She has never used smokeless tobacco. She reports current alcohol use. She reports that she does not use drugs.  Tobacco Cessation: Former smoker , Quit 06/2019 with a 49 pack year smoking history  Past surgical hx, Family hx, Social hx all reviewed.  Current Outpatient Medications on File Prior to Visit  Medication Sig  . albuterol (PROVENTIL HFA;VENTOLIN HFA) 108 (90 BASE) MCG/ACT inhaler Inhale 2 puffs into the lungs every 6 (six) hours as needed. For SOB  . albuterol (PROVENTIL) (2.5 MG/3ML) 0.083% nebulizer solution Take 2.5 mg by nebulization every 6 (six) hours as  needed. For SOB  . azithromycin (ZITHROMAX) 250 MG tablet Take 1 tab daily as directed.  . benzonatate (TESSALON) 200 MG capsule Take 1 capsule (200 mg total) by mouth 3 (three) times daily as needed for cough.  . Budeson-Glycopyrrol-Formoterol (BREZTRI AEROSPHERE) 160-9-4.8 MCG/ACT AERO Inhale 2 puffs into the lungs 2 (two) times daily.  . clotrimazole (MYCELEX) 10 MG troche Take 1 tablet (10 mg total) by mouth 5 (five) times daily.  . furosemide (LASIX) 40 MG tablet Take 1 tablet (40 mg total) by mouth as needed.  . gabapentin (NEURONTIN) 300 MG capsule Take 900 mg by mouth at bedtime.   Marland Kitchen ibuprofen (ADVIL,MOTRIN) 200 MG tablet Take 400 mg by mouth every 6 (six) hours as needed. For pain  . Insulin Aspart (NOVOLOG FLEXPEN [place]) Inject 20 Units into the skin 3 times daily with meals, bedtime and 2 AM. 20 units Am/ 15 units at lunch / 20 units PM  . insulin NPH Human (NOVOLIN N) 100 UNIT/ML injection Inject 20 Units into the skin in the morning and at bedtime. 20 units in AM/ 22 at HS  . irbesartan (AVAPRO) 150 MG tablet Take 1 tablet (150 mg total) by mouth daily.  . predniSONE (DELTASONE) 10 MG tablet Take  4 each am x 2 days,   2 each am x 2 days,  1 each am x 2 days and stop   No current facility-administered medications on file prior to visit.     No Known Allergies  Review Of Systems:  Constitutional:   No  weight loss, night sweats,  Fevers, chills, fatigue, or  lassitude.  HEENT:   No headaches,  Difficulty swallowing,  Tooth/dental problems, or  Sore throat,                No sneezing, itching, ear ache, nasal congestion, post nasal drip,   CV:  No chest pain,  Orthopnea, PND, swelling in lower extremities, anasarca, dizziness, palpitations, syncope.   GI  No heartburn, indigestion, abdominal pain, nausea, vomiting, diarrhea, change in bowel habits, loss of appetite, bloody stools.   Resp: No shortness of breath with exertion or at rest.  No excess mucus, no productive cough,  No  non-productive cough,  No coughing up of blood.  No change in color of mucus.  No wheezing.  No chest wall deformity  Skin: no rash or lesions.  GU: no dysuria, change in color of urine, no urgency or frequency.  No flank pain, no hematuria   MS:  No joint pain or swelling.  No decreased range of motion.  No back pain.  Psych:  No change in mood or affect. No depression or anxiety.  No memory loss.   Vital Signs LMP 11/02/2014 Comment: tubal ligation   Physical Exam:  General- No distress,  A&Ox3, wearing nasal oxygen ENT: No sinus tenderness, TM clear, pale nasal mucosa, no oral exudate,no post nasal drip, no LAN Cardiac: S1, S2, regular rate and rhythm, no murmur Chest: No wheeze/ rales/ + dullness; no accessory muscle use, no nasal flaring, no sternal retractions, long expiratory phase, diminished per bases Abd.: Soft Non-tender, ND, BS + , Body mass index is 46.46 kg/m. Ext: No clubbing cyanosis, edema Neuro:  normal strength, MAE x 4, A&O x 3, appropriate Skin: No rashes, No lesions, warm and dry Psych: normal mood and behavior   Assessment/Plan COPD Stable interval Doing Well on Breztri Plan Your PFT's confirm Severe COPD.  Please continue your Breztri 2 puffs in the morning and 2 puffs at bedtime.  Rinse mouth after use.  Use albuterol as needed for break through shortness of breath or wheezing. Continue wearing your oxygen as you have been doing. Saturation goals are 88-92%. Continue wearing oxygen at bedtime as you have been doing.   We will send an order for an incentive spirometer. Suck on this like a cigarette, watch the ball in the chamber. This will open alveoli at the bases of your lungs and help with oxygen exchange. We will order a flutter valve  Blow into this, and use Mucinex ( Plain blue package) when you have chest congestion and cannot get mucus up.  Use several times a day, as needed for the above. We will refer you to Pulmonary Rehab ( Under Dr.  Sherene Sires)  You will get a call to schedule I am unsure how well this is covered by Medicaid but we will try.  Note your daily symptoms > remember "red flags" for COPD:  Increase in cough, increase in sputum production, increase in shortness of breath or activity intolerance. If you notice these symptoms, please call to be seen. Follow up with Dr. Sherene Sires or Maralyn Sago NP  in 3 months We will refer you to the Lung Cancer Screening Program. You will get a call to get this scheduled.  Call us sooner if you need [place] sooner.  Please contact office for sooner follow up if symptoms do not improve or worsen or seek emergency care      Obesity contributing to dyspnea Body mass index is 46.46 kg/m.  Plan Refer to Healthy weight and wellness Refer to Pulmonary rehab  Tobacco Abuse Plan Counseled on remaining smoke free Referred to Lung Cancer Screening  Heart Failure Clinically well controlled No LE edema today on exam Plan Follow up with Cardiology   Bevelyn Ngo, NP 07/13/2020  9:06 AM

## 2020-07-15 NOTE — Progress Notes (Signed)
Called and left message on voicemail to please return phone call to go over PFT results. Contact number provided.

## 2020-07-15 NOTE — Progress Notes (Signed)
Patient returned phone call, confirmed name and birth date. Went over PFT results per Dr Sherene Sires with patient. All questions answered and patient expressed full understanding. Nothing further needed at this time.

## 2020-08-02 DEATH — deceased

## 2021-08-27 IMAGING — US US EXTREM LOW VENOUS*R*
1 series · 13 of 24 positions shown · non-contrast
Comparison: None.

CLINICAL DATA: Right lower extremity pain and edema the past 2
weeks. History of smoking. Evaluate for DVT.



[Series 1: us extrem low venous*right* · 0.09mm/px · 13 of 36 slices shown]
[im 1/36]
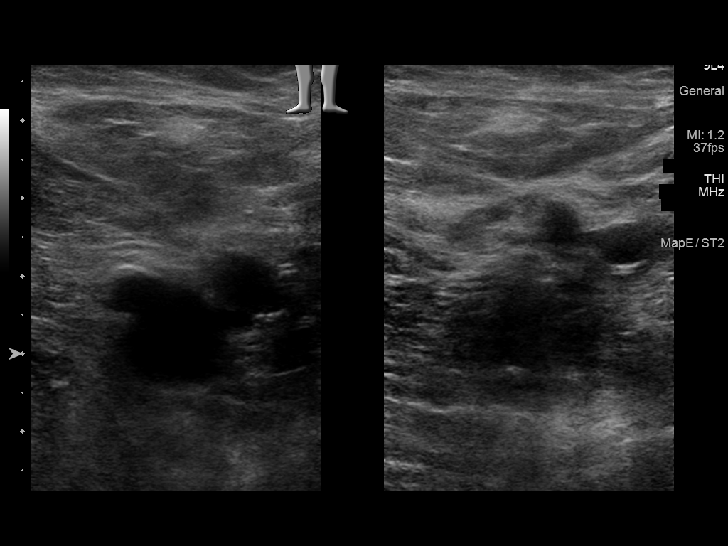
[im 4/36]
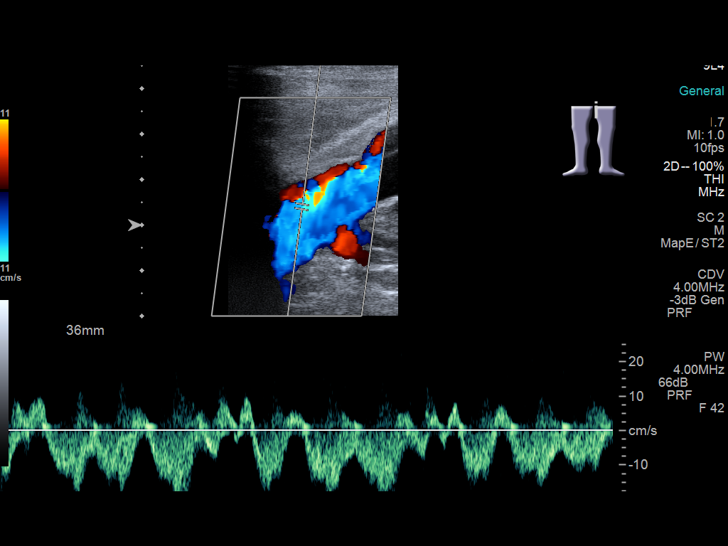
[im 7/36]
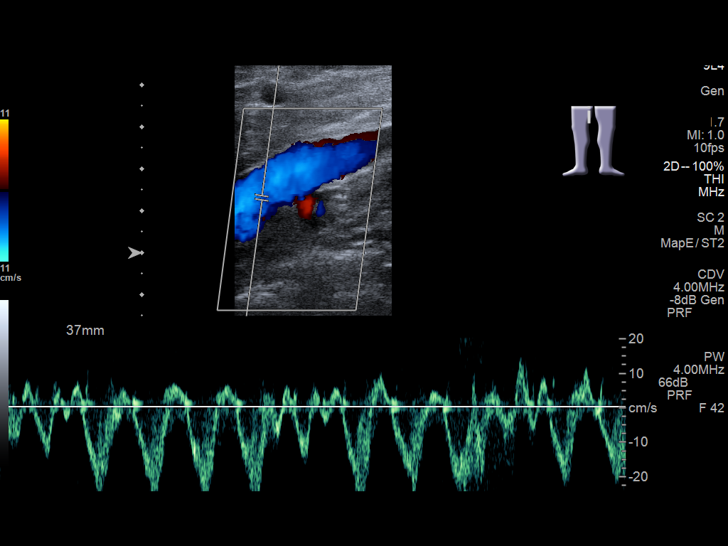
[im 10/36]
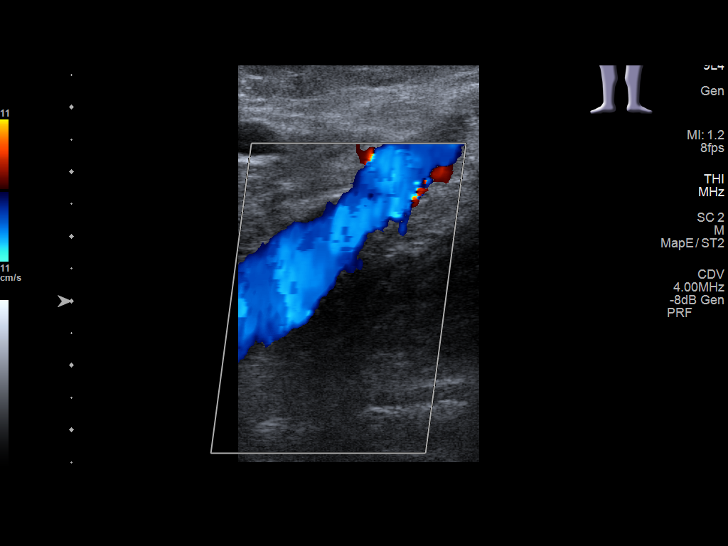
[im 13/36]
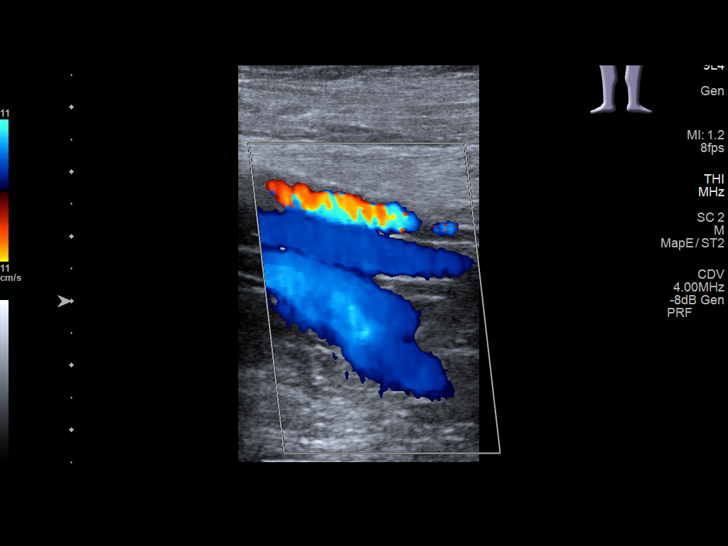
[im 16/36]
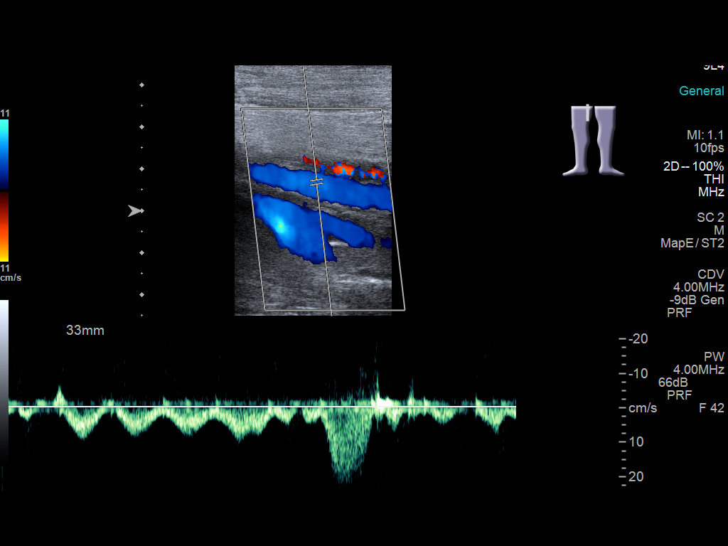
[im 19/36]
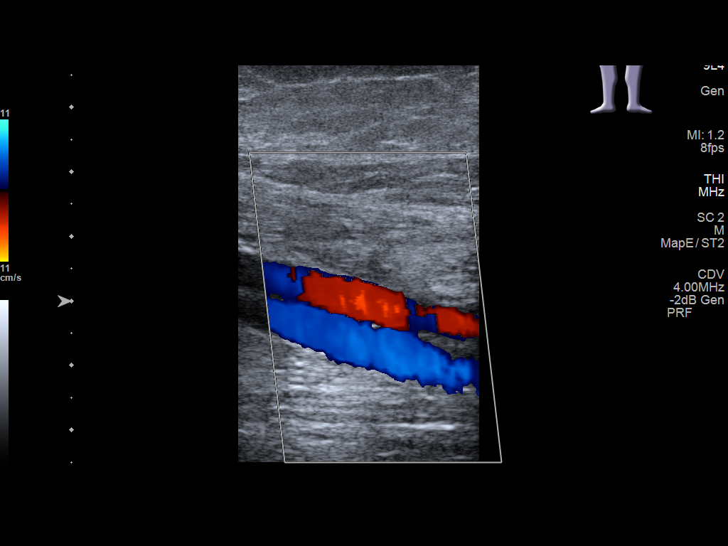
[im 20/36]
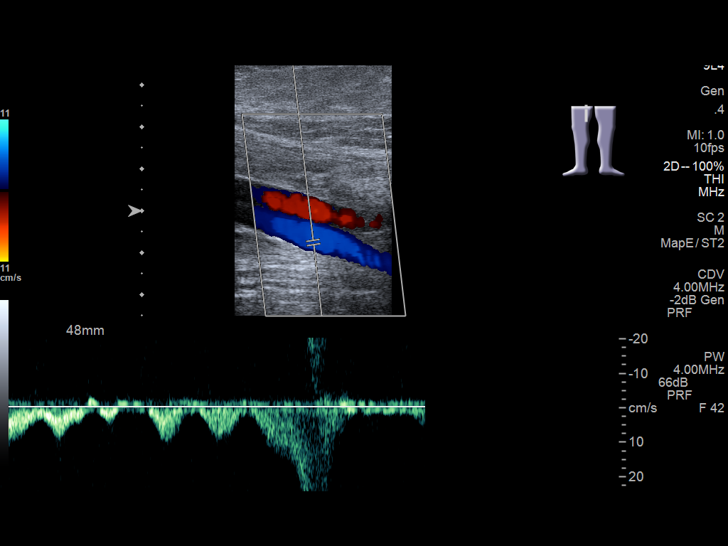
[im 23/36]
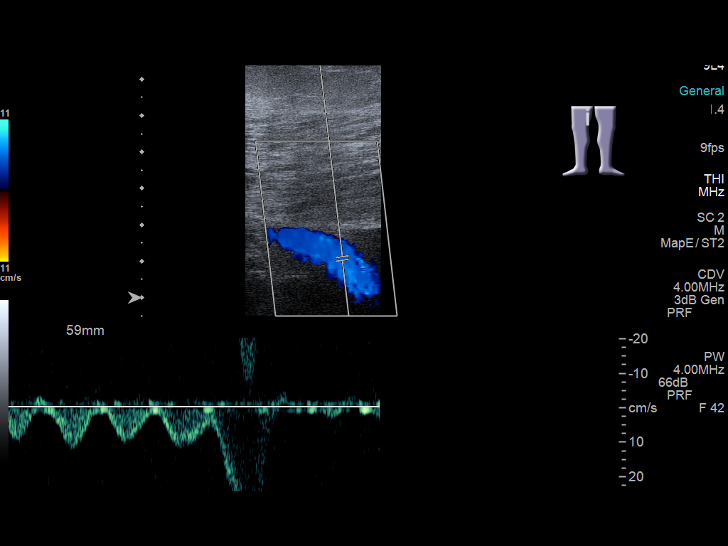
[im 26/36]
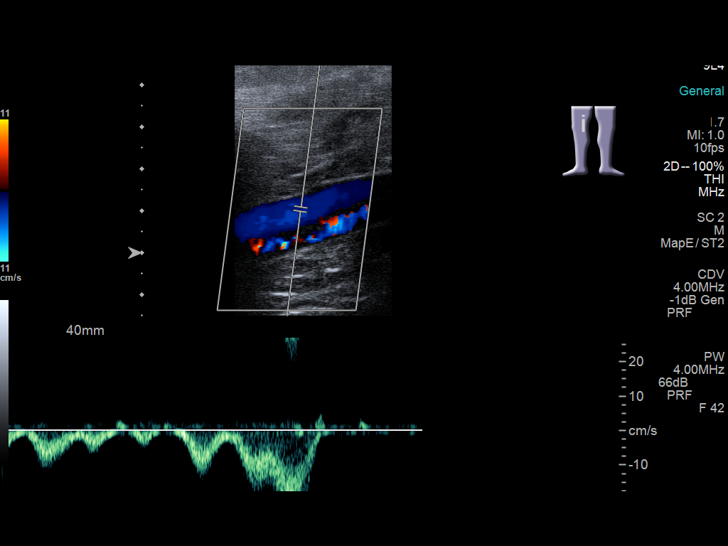
[im 29/36]
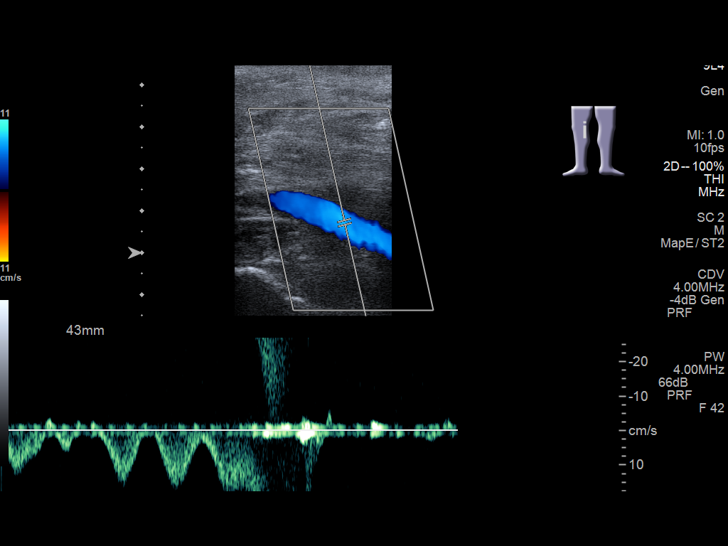
[im 32/36]
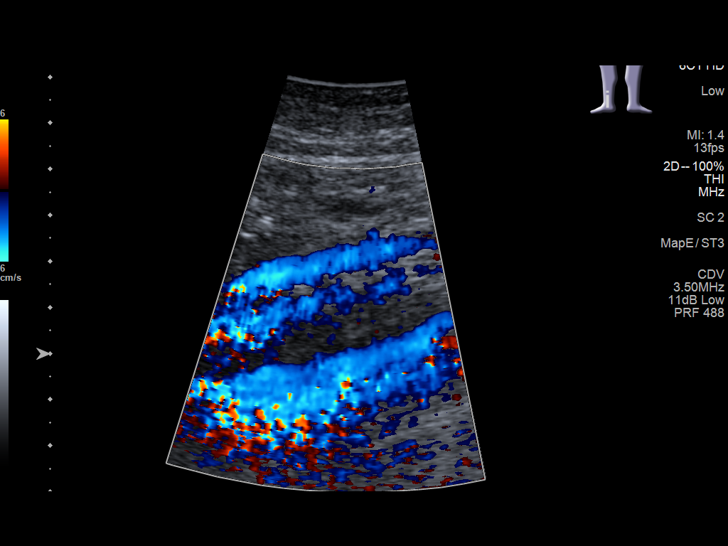
[im 36/36]
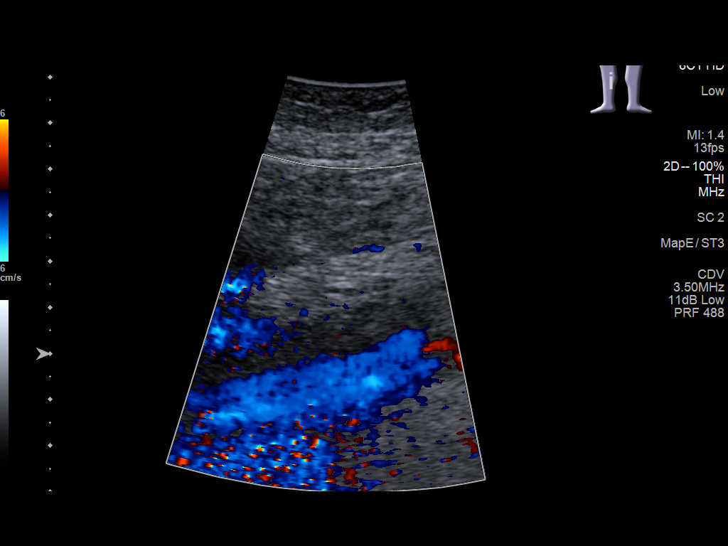

[13 of 24 positions shown; findings below may reference images not displayed]

FINDINGS: Contralateral Common Femoral Vein: Respiratory phasicity is normal
and symmetric with the symptomatic side. No evidence of thrombus.
Normal compressibility.

Common Femoral Vein: No evidence of thrombus. Normal
compressibility, respiratory phasicity and response to augmentation.

Saphenofemoral Junction: No evidence of thrombus. Normal
compressibility and flow on color Doppler imaging.

Profunda Femoral Vein: No evidence of thrombus. Normal
compressibility and flow on color Doppler imaging.

Femoral Vein: No evidence of thrombus. Normal compressibility,
respiratory phasicity and response to augmentation.

Popliteal Vein: No evidence of thrombus. Normal compressibility,
respiratory phasicity and response to augmentation.

Calf Veins: No evidence of thrombus. Normal compressibility and flow
on color Doppler imaging.

Superficial Great Saphenous Vein: No evidence of thrombus. Normal
compressibility.

Venous Reflux:  None.

Other Findings:  None.
IMPRESSION: No evidence of DVT within the right lower extremity.

## 2021-09-06 IMAGING — DX DG CHEST 1V PORT
1 series · 1 of 1 positions shown · non-contrast
Comparison: 06/14/2019

CLINICAL DATA: Heart failure per provider. Hx of COPD, DM, current
smoker. Follow-up exam.

EXAM:
PORTABLE CHEST 1 VIEW

[chest ap]
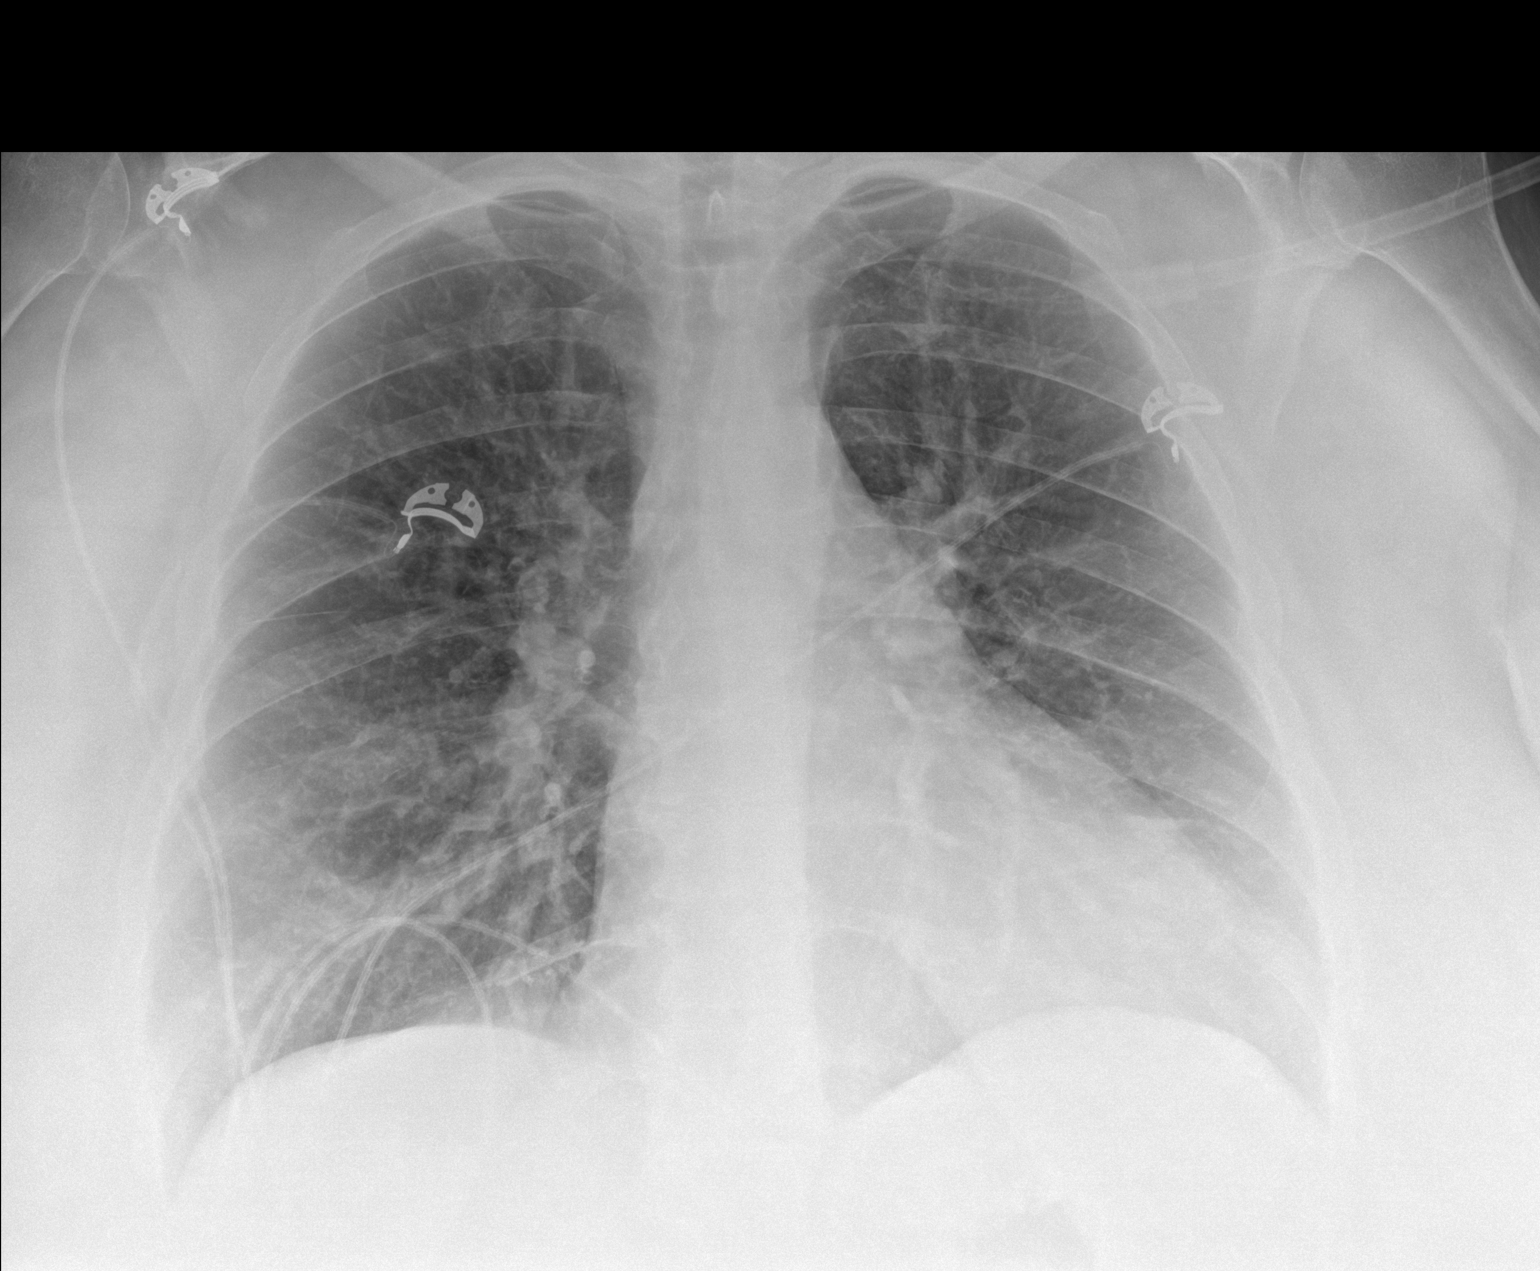

[1 of 1 positions shown; findings below may reference images not displayed]

FINDINGS: Cardiac silhouette is normal in size. No mediastinal or hilar
masses.

There is vascular congestion without overt pulmonary edema. Mild
interstitial thickening noted on the prior study has improved. No
lung consolidation.

No convincing pleural effusion and no pneumothorax.

Skeletal structures are grossly intact.
IMPRESSION: 1. Mild CHF suggested on the prior study has improved/resolved.
2. No acute cardiopulmonary disease.
